# Patient Record
Sex: Female | Born: 1975
Health system: Southern US, Community
[De-identification: ages and names within clinical notes are randomized; demographics above are authoritative.]

## PROBLEM LIST (undated history)

## (undated) DIAGNOSIS — L719 Rosacea, unspecified: Secondary | ICD-10-CM

## (undated) DIAGNOSIS — E119 Type 2 diabetes mellitus without complications: Secondary | ICD-10-CM

## (undated) DIAGNOSIS — M8580 Other specified disorders of bone density and structure, unspecified site: Secondary | ICD-10-CM

## (undated) DIAGNOSIS — R011 Cardiac murmur, unspecified: Secondary | ICD-10-CM

## (undated) DIAGNOSIS — O24419 Gestational diabetes mellitus in pregnancy, unspecified control: Secondary | ICD-10-CM

## (undated) DIAGNOSIS — K900 Celiac disease: Secondary | ICD-10-CM

## (undated) DIAGNOSIS — M359 Systemic involvement of connective tissue, unspecified: Secondary | ICD-10-CM

## (undated) DIAGNOSIS — L409 Psoriasis, unspecified: Secondary | ICD-10-CM

## (undated) HISTORY — DX: Type 2 diabetes mellitus without complications: E11.9

## (undated) HISTORY — PX: BARTHOLIN GLAND CYST EXCISION: SHX565

## (undated) HISTORY — PX: KNEE ARTHROSCOPY: SUR90

## (undated) HISTORY — DX: Celiac disease: K90.0

## (undated) HISTORY — PX: WISDOM TOOTH EXTRACTION: SHX21

## (undated) HISTORY — DX: Rosacea, unspecified: L71.9

## (undated) HISTORY — DX: Psoriasis, unspecified: L40.9

## (undated) HISTORY — DX: Other specified disorders of bone density and structure, unspecified site: M85.80

## (undated) HISTORY — DX: Cardiac murmur, unspecified: R01.1

---

## 2004-07-10 ENCOUNTER — Ambulatory Visit: Payer: Self-pay | Admitting: Family Medicine

## 2004-08-02 ENCOUNTER — Other Ambulatory Visit: Admission: RE | Admit: 2004-08-02 | Discharge: 2004-08-02 | Payer: Self-pay | Admitting: Obstetrics and Gynecology

## 2005-07-04 ENCOUNTER — Ambulatory Visit: Payer: Self-pay | Admitting: Family Medicine

## 2005-07-16 ENCOUNTER — Ambulatory Visit: Payer: Self-pay | Admitting: Internal Medicine

## 2005-07-31 ENCOUNTER — Ambulatory Visit: Payer: Self-pay | Admitting: Family Medicine

## 2005-09-19 ENCOUNTER — Other Ambulatory Visit: Admission: RE | Admit: 2005-09-19 | Discharge: 2005-09-19 | Payer: Self-pay | Admitting: Obstetrics & Gynecology

## 2005-11-25 ENCOUNTER — Ambulatory Visit: Payer: Self-pay | Admitting: Family Medicine

## 2006-01-23 ENCOUNTER — Ambulatory Visit: Payer: Self-pay | Admitting: Family Medicine

## 2006-11-18 DIAGNOSIS — R799 Abnormal finding of blood chemistry, unspecified: Secondary | ICD-10-CM | POA: Insufficient documentation

## 2006-11-18 DIAGNOSIS — I7300 Raynaud's syndrome without gangrene: Secondary | ICD-10-CM | POA: Insufficient documentation

## 2006-11-18 DIAGNOSIS — K900 Celiac disease: Secondary | ICD-10-CM | POA: Insufficient documentation

## 2006-11-18 DIAGNOSIS — D709 Neutropenia, unspecified: Secondary | ICD-10-CM | POA: Insufficient documentation

## 2006-11-18 DIAGNOSIS — M949 Disorder of cartilage, unspecified: Secondary | ICD-10-CM

## 2006-11-18 DIAGNOSIS — M899 Disorder of bone, unspecified: Secondary | ICD-10-CM | POA: Insufficient documentation

## 2006-12-04 ENCOUNTER — Emergency Department (HOSPITAL_COMMUNITY): Admission: EM | Admit: 2006-12-04 | Discharge: 2006-12-04 | Payer: Self-pay | Admitting: Emergency Medicine

## 2006-12-08 ENCOUNTER — Ambulatory Visit: Payer: Self-pay | Admitting: Internal Medicine

## 2006-12-11 ENCOUNTER — Ambulatory Visit: Payer: Self-pay | Admitting: Internal Medicine

## 2006-12-17 ENCOUNTER — Ambulatory Visit: Payer: Self-pay | Admitting: Internal Medicine

## 2006-12-17 LAB — CONVERTED CEMR LAB: H Pylori IgG: NEGATIVE

## 2006-12-29 ENCOUNTER — Ambulatory Visit: Payer: Self-pay | Admitting: Internal Medicine

## 2006-12-29 ENCOUNTER — Encounter: Payer: Self-pay | Admitting: Family Medicine

## 2006-12-29 ENCOUNTER — Encounter: Payer: Self-pay | Admitting: Internal Medicine

## 2007-02-03 ENCOUNTER — Ambulatory Visit (HOSPITAL_COMMUNITY): Admission: RE | Admit: 2007-02-03 | Discharge: 2007-02-03 | Payer: Self-pay | Admitting: Internal Medicine

## 2007-03-09 ENCOUNTER — Telehealth (INDEPENDENT_AMBULATORY_CARE_PROVIDER_SITE_OTHER): Payer: Self-pay | Admitting: *Deleted

## 2007-03-16 ENCOUNTER — Ambulatory Visit: Payer: Self-pay | Admitting: Family Medicine

## 2007-03-16 DIAGNOSIS — R110 Nausea: Secondary | ICD-10-CM

## 2007-03-16 DIAGNOSIS — F411 Generalized anxiety disorder: Secondary | ICD-10-CM | POA: Insufficient documentation

## 2007-06-19 ENCOUNTER — Ambulatory Visit: Payer: Self-pay | Admitting: Family Medicine

## 2007-08-03 ENCOUNTER — Encounter: Payer: Self-pay | Admitting: Family Medicine

## 2007-08-03 ENCOUNTER — Ambulatory Visit: Payer: Self-pay | Admitting: Internal Medicine

## 2007-08-05 ENCOUNTER — Ambulatory Visit: Payer: Self-pay | Admitting: Internal Medicine

## 2007-08-05 DIAGNOSIS — J019 Acute sinusitis, unspecified: Secondary | ICD-10-CM

## 2007-12-31 ENCOUNTER — Encounter: Payer: Self-pay | Admitting: Family Medicine

## 2008-02-04 DIAGNOSIS — K219 Gastro-esophageal reflux disease without esophagitis: Secondary | ICD-10-CM | POA: Insufficient documentation

## 2008-02-05 ENCOUNTER — Ambulatory Visit: Payer: Self-pay | Admitting: Internal Medicine

## 2008-02-05 LAB — CONVERTED CEMR LAB: Tissue Transglutaminase Ab, IgA: 1.2 units (ref <7)

## 2008-02-09 ENCOUNTER — Encounter: Payer: Self-pay | Admitting: Internal Medicine

## 2008-06-09 ENCOUNTER — Encounter: Payer: Self-pay | Admitting: Family Medicine

## 2008-09-07 ENCOUNTER — Encounter: Admission: RE | Admit: 2008-09-07 | Discharge: 2008-09-07 | Payer: Self-pay | Admitting: Obstetrics and Gynecology

## 2008-12-01 ENCOUNTER — Ambulatory Visit: Payer: Self-pay | Admitting: Family Medicine

## 2008-12-01 DIAGNOSIS — R599 Enlarged lymph nodes, unspecified: Secondary | ICD-10-CM | POA: Insufficient documentation

## 2008-12-01 DIAGNOSIS — Z77011 Contact with and (suspected) exposure to lead: Secondary | ICD-10-CM | POA: Insufficient documentation

## 2008-12-02 LAB — CONVERTED CEMR LAB: Anti Nuclear Antibody(ANA): NEGATIVE

## 2008-12-04 LAB — CONVERTED CEMR LAB
Basophils Absolute: 0 10*3/uL (ref 0.0–0.1)
Hemoglobin: 11.8 g/dL — ABNORMAL LOW (ref 12.0–15.0)
Lymphocytes Relative: 22.1 % (ref 12.0–46.0)
Monocytes Relative: 9.9 % (ref 3.0–12.0)
Neutro Abs: 3.9 10*3/uL (ref 1.4–7.7)
Platelets: 138 10*3/uL — ABNORMAL LOW (ref 150.0–400.0)
RDW: 12.3 % (ref 11.5–14.6)
WBC: 5.8 10*3/uL (ref 4.5–10.5)

## 2008-12-05 ENCOUNTER — Encounter (INDEPENDENT_AMBULATORY_CARE_PROVIDER_SITE_OTHER): Payer: Self-pay | Admitting: *Deleted

## 2009-01-07 IMAGING — NM NM HEPATO W/GB/PHARM/[PERSON_NAME]
1 series · 6 of 6 positions shown · non-contrast
Comparison: Abdominal CT 12/11/06.

CLINICAL DATA: Abdominal pain with nausea and vomiting. 
 NUCLEAR MEDICINE HEPATOBILIARY SCAN WITH EJECTION FRACTION:
TECHNIQUE: Sequential abdominal images were obtained following intravenous injection of radiopharmaceutical.  Sequential images were continued following oral ingestion of 8 oz. half-and-half, and the gallbladder ejection fraction was calculated.
 Radiopharmaceutical:  5.2 mCi Yc-LLm Choletec and 8 oz of half-and-half cream.

[hida · 2.40mm/px · 6 of 60 frames shown]
[frame 6/60]
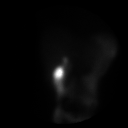
[frame 16/60]
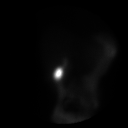
[frame 26/60]
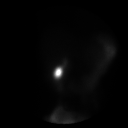
[frame 36/60]
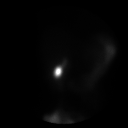
[frame 46/60]
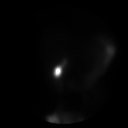
[frame 56/60]
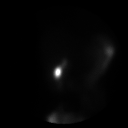

[6 of 6 positions shown; findings below may reference images not displayed]

FINDINGS: The initial images demonstrate homogeneous hepatic activity. There is spontaneous opacification of the biliary system and small bowel.   The stimulated portion of the examination does demonstrate slightly decreased gallbladder contraction.   The gallbladder ejection fraction calculated at 1 hour is 43%.  Values greater than 50% are generally considered normal at this interval.
IMPRESSION: Borderline abnormal gallbladder ejection fraction of 43%.  The cystic and common bile ducts are patent.

## 2009-03-07 ENCOUNTER — Inpatient Hospital Stay (HOSPITAL_COMMUNITY): Admission: RE | Admit: 2009-03-07 | Discharge: 2009-03-10 | Payer: Self-pay | Admitting: Obstetrics & Gynecology

## 2009-03-11 ENCOUNTER — Encounter: Admission: RE | Admit: 2009-03-11 | Discharge: 2009-04-10 | Payer: Self-pay | Admitting: Obstetrics & Gynecology

## 2009-04-11 ENCOUNTER — Encounter: Admission: RE | Admit: 2009-04-11 | Discharge: 2009-05-11 | Payer: Self-pay | Admitting: Obstetrics & Gynecology

## 2009-04-14 ENCOUNTER — Inpatient Hospital Stay (HOSPITAL_COMMUNITY): Admission: AD | Admit: 2009-04-14 | Discharge: 2009-04-14 | Payer: Self-pay | Admitting: Obstetrics and Gynecology

## 2009-04-14 ENCOUNTER — Telehealth: Payer: Self-pay | Admitting: Internal Medicine

## 2010-05-01 ENCOUNTER — Encounter: Payer: Self-pay | Admitting: Family Medicine

## 2010-05-01 ENCOUNTER — Ambulatory Visit: Payer: Self-pay | Admitting: Family Medicine

## 2010-05-01 DIAGNOSIS — R002 Palpitations: Secondary | ICD-10-CM

## 2010-05-01 DIAGNOSIS — L218 Other seborrheic dermatitis: Secondary | ICD-10-CM

## 2010-05-01 LAB — CONVERTED CEMR LAB
Nitrite: NEGATIVE
Protein, U semiquant: NEGATIVE
Urobilinogen, UA: NEGATIVE

## 2010-05-02 ENCOUNTER — Telehealth: Payer: Self-pay | Admitting: Family Medicine

## 2010-05-04 LAB — CONVERTED CEMR LAB: Vit D, 25-Hydroxy: 43 ng/mL (ref 30–89)

## 2010-05-07 LAB — CONVERTED CEMR LAB
Basophils Relative: 0.9 % (ref 0.0–3.0)
CO2: 26 meq/L (ref 19–32)
Calcium: 9.2 mg/dL (ref 8.4–10.5)
Direct LDL: 141.6 mg/dL
Eosinophils Absolute: 0.1 10*3/uL (ref 0.0–0.7)
Eosinophils Relative: 2.2 % (ref 0.0–5.0)
Folate: >20 ng/mL
Hemoglobin: 14.9 g/dL (ref 12.0–15.0)
Lymphocytes Relative: 40.3 % (ref 12.0–46.0)
MCHC: 34.4 g/dL (ref 30.0–36.0)
Monocytes Relative: 12.8 % — ABNORMAL HIGH (ref 3.0–12.0)
Neutro Abs: 1.7 10*3/uL (ref 1.4–7.7)
Neutrophils Relative %: 43.8 % (ref 43.0–77.0)
RBC: 4.61 M/uL (ref 3.87–5.11)
Sodium: 139 meq/L (ref 135–145)
TSH: 0.73 microintl units/mL (ref 0.35–5.50)
Total Bilirubin: 0.6 mg/dL (ref 0.3–1.2)
Total CHOL/HDL Ratio: 3
Triglycerides: 38 mg/dL (ref 0.0–149.0)
VLDL: 7.6 mg/dL (ref 0.0–40.0)
Vitamin B-12: 484 pg/mL (ref 211–911)
WBC: 3.8 10*3/uL — ABNORMAL LOW (ref 4.5–10.5)

## 2010-08-19 ENCOUNTER — Encounter: Payer: Self-pay | Admitting: Internal Medicine

## 2010-08-28 NOTE — Assessment & Plan Note (Signed)
Summary: CPX, FASTING, [employer]/RH.......Marland Kitchen   Vital Signs:  Patient profile:   35 year old female Height:      65 inches Weight:      109.2 pounds BMI:     18.24 Pulse rate:   64 / minute Pulse rhythm:   regular BP sitting:   100 / 70  (right arm) Cuff size:   regular  Vitals Entered By: Aron Baba CMA Deborra Medina) (May 01, 2010 36:01 AM) CC: cpx/fasting, wants to discuss heart flutters, Cough   History of Present Illness: Pt here for cpe  and labs.  Pt having palpatations at rest that occur every few days--not everyday.    Pt states she has the stress that goes along with a 78mold baby and is drinking only 1-2 cups caffeine a Goree---they are usually only half decaf.   No cp --- it does take her breath away for a moment.     Cough      This is a 35year old woman who presents with Cough.  The patient reports productive cough, but denies non-productive cough, pleuritic chest pain, shortness of breath, wheezing, exertional dyspnea, fever, hemoptysis, and malaise.  Associated symtpoms include cold/URI symptoms and nasal congestion.  The patient denies the following symptoms: sore throat, chronic rhinitis, weight loss, acid reflux symptoms, and peripheral edema.  The cough is worse with activity and lying down.  Ineffective prior treatments have included OTC cough medication.    Preventive Screening-Counseling & Management  Alcohol-Tobacco     Alcohol drinks/Rodell: <1     Alcohol type: wine     Smoking Status: never  Caffeine-Diet-Exercise     Caffeine use/[place]orn: 1-2     Caffeine Counseling: not indicated; caffeine use is not excessive or problematic     Does Patient Exercise: no     Exercise Counseling: to improve exercise regimen  Hep-HIV-STD-Contraception     Dental Visit-last 6 months yes     Dental Care Counseling: to seek dental care; no dental care within six months     SBE monthly: yes     SBE Education/Counseling: not indicated; SBE done regularly      Sexual History:   currently monogamous.        Drug Use:  no.    Current Medications (verified): 1)  Mvi, Calcium 2)  Elidel 1 % Crea (Pimecrolimus) .... Apply To Affected Areas As Needed 3)  Fluocinonide 0.05 % Soln (Fluocinonide) 4)  Augmentin 875-125 Mg Tabs (Amoxicillin-Pot Clavulanate) ..Marland Kitchen. 1 By Mouth Two Times A [place] 5)  Mucinex Dm 30-600 Mg Xr12h-Tab (Dextromethorphan-Guaifenesin)  Allergies (verified): 1)  ! Biaxin  Past History:  Past Medical History: Last updated: 02/05/2008 h/o Leukopenia, s/[place] specialist [place] Current Problems:  GERD (ICD-530.81) SINUSITIS- ACUTE-NOS (ICD-461.9) NAUSEA, CHRONIC (ICD-787.02) ANXIETY STATE NOS (ICD-300.00) NEUTROPENIA NOS (ICD-288.00) ANA POSITIVE (ICD-790.99) RAYNAUD'S SYNDROME (ICD-443.0) CELIAC SPRUE (ICD-579.0) OSTEOPENIA (ICD-733.90)  Family History: Last updated: 02/04/2008 Family History of Irritable Bowel Syndrome: Father Family History of Heart Disease: Mother, Father Family History of Colon Cancer: Mternal Grandfather Family History of Colon Polyps: Mother, Father Family History of Diabetes: Grandmother  Social History: Last updated: 05/01/2010 Occupation: stay at home mom Alcohol Use - yes Patient has never smoked.  Married Never Smoked Alcohol use-yes Drug use-no  Risk Factors: Alcohol Use: <1 (05/01/2010) Caffeine Use: 1-2 (05/01/2010) Exercise: no (05/01/2010)  Risk Factors: Smoking Status: never (05/01/2010)  Past Surgical History: Right knee arthroscopy oral surery (wisdom teeth) Caesarean section (2010)  root canals x 2  Family History: Reviewed history from 02/04/2008 and no changes required. Family History of Irritable Bowel Syndrome: Father Family History of Heart Disease: Mother, Father Family History of Colon Cancer: Mternal Grandfather Family History of Colon Polyps: Mother, Father Family History of Diabetes: Grandmother  Social History: Reviewed history from 02/05/2008 and no changes  required. Occupation: stay at home mom Alcohol Use - yes Patient has never smoked.  Married Never Smoked Alcohol use-yes Drug use-no Caffeine use/[place]:  1-2 Does Patient Exercise:  no Dental Care w/in 6 mos.:  yes Sexual History:  currently monogamous Drug Use:  no  Review of Systems      See HPI General:  Denies chills, fatigue, fever, loss of appetite, malaise, sleep disorder, sweats, weakness, and weight loss. Eyes:  Denies blurring, discharge, double vision, eye irritation, eye pain, halos, itching, light sensitivity, red eye, vision loss-1 eye, and vision loss-both eyes; optho-q2y. ENT:  Denies decreased hearing, difficulty swallowing, ear discharge, earache, hoarseness, nasal congestion, nosebleeds, postnasal drainage, ringing in ears, sinus pressure, and sore throat. CV:  Denies bluish discoloration of lips or nails, chest pain or discomfort, difficulty breathing at night, difficulty breathing while lying down, fainting, fatigue, leg cramps with exertion, lightheadness, near fainting, palpitations, shortness of breath with exertion, swelling of feet, swelling of hands, and weight gain. Resp:  Denies chest discomfort, chest pain with inspiration, cough, coughing up blood, excessive snoring, hypersomnolence, morning headaches, pleuritic, shortness of breath, sputum productive, and wheezing. GI:  Denies abdominal pain, bloody stools, change in bowel habits, constipation, dark tarry stools, diarrhea, excessive appetite, gas, hemorrhoids, indigestion, loss of appetite, nausea, vomiting, vomiting blood, and yellowish skin color; celiac sprue--- hospitalized last year--Dr Olevia Perches. GU:  Denies abnormal vaginal bleeding, decreased libido, discharge, dysuria, genital sores, hematuria, incontinence, nocturia, urinary frequency, and urinary hesitancy. MS:  Denies joint pain, joint redness, joint swelling, loss of strength, low back pain, mid back pain, muscle aches, muscle , cramps, muscle weakness,  stiffness, and thoracic pain. Derm:  Denies changes in color of skin, changes in nail beds, dryness, excessive perspiration, flushing, hair loss, insect bite(s), itching, lesion(s), poor wound healing, and rash. Neuro:  Denies brief paralysis, difficulty with concentration, disturbances in coordination, falling down, headaches, inability to speak, memory loss, numbness, poor balance, seizures, sensation of room spinning, tingling, tremors, visual disturbances, and weakness. Psych:  Denies alternate hallucination ( auditory/visual), anxiety, depression, easily angered, easily tearful, irritability, mental problems, panic attacks, sense of great danger, suicidal thoughts/plans, thoughts of violence, unusual visions or sounds, and thoughts /plans of harming others. Endo:  Denies cold intolerance, excessive hunger, excessive thirst, excessive urination, heat intolerance, polyuria, and weight change. Heme:  Denies abnormal bruising, bleeding, enlarge lymph nodes, fevers, pallor, and skin discoloration. Allergy:  Denies hives or rash, itching eyes, persistent infections, seasonal allergies, and sneezing.  Physical Exam  General:  Well-developed,well-nourished,in no acute distress; alert,appropriate and cooperative throughout examination Head:  Normocephalic and atraumatic without obvious abnormalities. No apparent alopecia or balding. Eyes:  vision grossly intact, pupils equal, pupils round, and pupils reactive to light.   Ears:  External ear exam shows no significant lesions or deformities.  Otoscopic examination reveals clear canals, tympanic membranes are intact bilaterally without bulging, retraction, inflammation or discharge. Hearing is grossly normal bilaterally. Nose:  External nasal examination shows no deformity or inflammation. Nasal mucosa are pink and moist without lesions or exudates. Mouth:  Oral mucosa and oropharynx without lesions or exudates.  Teeth in good repair. Neck:  No  deformities,  masses, or tenderness noted. Breasts:  gyn Lungs:  R wheezes and L wheezes.   Heart:  Normal rate and regular rhythm. S1 and S2 normal without gallop, murmur, click, rub or other extra sounds. Abdomen:  Bowel sounds positive,abdomen soft and non-tender without masses, organomegaly or hernias noted. Genitalia:  gyn Msk:  normal ROM, no joint tenderness, no joint swelling, no joint warmth, no redness over joints, no joint deformities, no joint instability, and no crepitation.   Pulses:  R posterior tibial normal, R dorsalis pedis normal, R carotid normal, L posterior tibial normal, L dorsalis pedis normal, and L carotid normal.   Extremities:  No clubbing, cyanosis, edema, or deformity noted with normal full range of motion of all joints.   Neurologic:  No cranial nerve deficits noted. Station and gait are normal. Plantar reflexes are down-going bilaterally. DTRs are symmetrical throughout. Sensory, motor and coordinative functions appear intact. Skin:  Intact without suspicious lesions or rashes Cervical Nodes:  No lymphadenopathy noted Axillary Nodes:  No palpable lymphadenopathy Psych:  Cognition and judgment appear intact. Alert and cooperative with normal attention span and concentration. No apparent delusions, illusions, hallucinations   Impression & Recommendations:  Problem # 1:  Madeline Flores (ICD-V70.0)  Orders: Venipuncture (73710) TLB-Lipid Panel (80061-LIPID) TLB-BMP (Basic Metabolic Panel-BMET) (62694-WNIOEVO) TLB-CBC Platelet - w/Differential (85025-CBCD) TLB-Hepatic/Liver Function Pnl (80076-HEPATIC) TLB-TSH (Thyroid Stimulating Hormone) (84443-TSH) TLB-B12 + Folate Pnl (35009_38182-X93/ZJI) T-Vitamin D (25-Hydroxy) (96789-38101) Specimen Handling (99000) UA Dipstick W/ Micro (manual) (81000)  Problem # 2:  BRONCHITIS- ACUTE (ICD-466.0)  Her updated medication list for this problem includes:    Augmentin 875-125 Mg Tabs (Amoxicillin-pot clavulanate) .Marland Kitchen... 1  by mouth two times a Latella    Mucinex Dm 30-600 Mg Xr12h-tab (Dextromethorphan-guaifenesin)  Take antibiotics and other medications as directed. Encouraged to push clear liquids, get enough rest, and take acetaminophen as needed. To be seen in 5-7 days if no improvement, sooner if worse.  Problem # 3:  PALPITATIONS (ICD-785.1)  Avoid caffeine, chocolate, and stimulants. Stress reduction as well as medication options discussed.   Problem # 5:  PALPITATIONS, OCCASIONAL (ICD-785.1) check event monitor check labs Avoid caffeine, chocolate, and stimulants. Stress reduction as well as medication options discussed.   Complete Medication List: 1)  Mvi, Calcium  2)  Elidel 1 % Crea (Pimecrolimus) .... Apply to affected areas as needed 3)  Fluocinonide 0.05 % Soln (Fluocinonide) 4)  Augmentin 875-125 Mg Tabs (Amoxicillin-pot clavulanate) .Marland Kitchen.. 1 by mouth two times a Hel[place] 5)  Mucinex Dm 30-600 Mg Xr12h-tab (Dextromethorphan-guaifenesin)  Other Orders: Admin 1st Vaccine (75102) Flu Vaccine 69yr + ((58527 Tdap => 725yrIM (9(78242Admin of Any Addtl Vaccine (9(35361Cardiology Referral (Cardiology) Flu Vaccine Consent Questions     Do you have a history of severe allergic reactions to this vaccine? no    Any prior history of allergic reactions to egg and/or gelatin? no    Do you have a sensitivity to the preservative Thimersol? no    Do you have a past history of Guillan-Barre Syndrome? no    Do you currently have an acute febrile illness? no    Have you ever had a severe reaction to latex? no    Vaccine information given and explained to patient? yes    Are you currently pregnant? no    Lot Number:[id number]   Exp Date:01/26/2011   Site Given  Left Deltoid IM Admin 1st Vaccine (9(44315Flu Vaccine 3y37yr (90(40086rescriptions: AUGMENTIN 875-125 MG TABS (AMOXICILLIN-POT  CLAVULANATE) 1 by mouth two times a Roam  #20 x 0   Entered and Authorized by:   Garnet Koyanagi DO   Signed by:   Garnet Koyanagi DO  on 05/01/2010   Method used:   Electronically to        [facility] [phone]* (retail)       [address]       [place], [place]  [address]       Ph: [phone] or [phone]       Fax: [phone]   RxID:   [id number]  .lbflu   Immunizations Administered:  Tetanus Vaccine:    Vaccine Type: Tdap    Site: right deltoid    Mfr: Merck    Dose: 0.5 ml    Route: IM    Given by: Aron Baba CMA ([place])    Exp. Date: 05/17/2012    Lot #: [id number]    VIS given: 06/15/08 version given May 01, 2010.   Immunizations Administered:  Tetanus Vaccine:    Vaccine Type: Tdap    Site: right deltoid    Mfr: Merck    Dose: 0.5 ml    Route: IM    Given by: Aron Baba CMA ([place])    Exp. Date: 05/17/2012    Lot #: [id number]    VIS given: 06/15/08 version given May 01, 2010.   Laboratory Results   Urine Tests   Date/Time Reported: May 01, 2010 10:54 AM   Routine Urinalysis   Color: yellow Appearance: Clear Glucose: negative   (Normal Range: Negative) Bilirubin: negative   (Normal Range: Negative) Ketone: smal (15)   (Normal Range: Negative) Spec. Gravity: <1.005   (Normal Range: 1.003-1.035) Blood: negative   (Normal Range: Negative) pH: 6.5   (Normal Range: 5.0-8.0) Protein: negative   (Normal Range: Negative) Urobilinogen: negative   (Normal Range: 0-1) Nitrite: negative   (Normal Range: Negative) Leukocyte Esterace: negative   (Normal Range: Negative)    Comments: Heath Lark  May 01, 2010 10:54 AM

## 2010-08-28 NOTE — Progress Notes (Signed)
Summary: EVENT MONITOR REFERRAL  Phone Note Other Incoming Call back at [phone] EXT [phone]   Caller: [facility] Summary of Call: IN REFERENCE TO CARDIOLOGY REFERRAL FOR AN EVENT MONITOR, AFTER FAXING ALL CLINICALS & EKG TO [employer] CLINICAL REVIEW, [employer] JUST CALLED & WILL NOT APPROVE THE EVENT MONITOR.  THEIR REASON IS THAT THERE IS NO TREATMENT PLAN IN YOUR NOTES FOR THE PATIENT'S DIAGNOSIS.  I TOLD THEM THAT IS THE PURPOSE OF THE EVENT MONITOR, SO SHE CAN BE TREATED, ETC.... FOR APPROVAL, THEY ARE REQUIRING PEER TO PEER BY CALLING [phone], EXTENSION [phone]. Initial call taken by: Phylliss Bob [facility],  May 02, 2010 10:22 AM  Follow-up for Phone Call        just refer to cardio for eval Follow-up by: Garnet Koyanagi DO,  May 02, 2010 10:59 AM  Additional Follow-up for Phone Call Additional follow up Details #1::        [place]. Additional Follow-up by: Phylliss Bob [facility],  May 11, 2010 1:35 PM

## 2010-10-02 ENCOUNTER — Other Ambulatory Visit: Payer: Self-pay | Admitting: Obstetrics and Gynecology

## 2010-11-02 LAB — CBC
HCT: 43 % (ref 36.0–46.0)
Platelets: 166 10*3/uL (ref 150–400)
RDW: 12.2 % (ref 11.5–15.5)
WBC: 3.9 10*3/uL — ABNORMAL LOW (ref 4.0–10.5)

## 2010-11-02 LAB — COMPREHENSIVE METABOLIC PANEL
Albumin: 4.1 g/dL (ref 3.5–5.2)
BUN: 5 mg/dL — ABNORMAL LOW (ref 6–23)
Creatinine, Ser: 0.77 mg/dL (ref 0.4–1.2)
Total Bilirubin: 0.6 mg/dL (ref 0.3–1.2)
Total Protein: 7.3 g/dL (ref 6.0–8.3)

## 2010-11-03 LAB — CBC
HCT: 30.9 % — ABNORMAL LOW (ref 36.0–46.0)
HCT: 33.6 % — ABNORMAL LOW (ref 36.0–46.0)
HCT: 42.5 % (ref 36.0–46.0)
Hemoglobin: 10.9 g/dL — ABNORMAL LOW (ref 12.0–15.0)
Hemoglobin: 14.6 g/dL (ref 12.0–15.0)
MCHC: 34.3 g/dL (ref 30.0–36.0)
MCHC: 35.1 g/dL (ref 30.0–36.0)
MCV: 96.6 fL (ref 78.0–100.0)
MCV: 98.8 fL (ref 78.0–100.0)
Platelets: 133 10*3/uL — ABNORMAL LOW (ref 150–400)
RBC: 3.2 MIL/uL — ABNORMAL LOW (ref 3.87–5.11)
RBC: 4.39 MIL/uL (ref 3.87–5.11)
RDW: 12.7 % (ref 11.5–15.5)
WBC: 10.3 10*3/uL (ref 4.0–10.5)
WBC: 6.5 10*3/uL (ref 4.0–10.5)
WBC: 8.8 10*3/uL (ref 4.0–10.5)

## 2010-12-11 NOTE — Discharge Summary (Signed)
NAME:  Madeline Flores, Madeline Flores                 ACCOUNT NO.:  [id number]   MEDICAL RECORD NO.:  [id number]          PATIENT TYPE:  INP   LOCATION:  9132                          FACILITY:  [facility]   PHYSICIAN:  Jeannette How, P.A.-C.DATE OF BIRTH:  07-Jan-1976   DATE OF ADMISSION:  03/07/2009  DATE OF DISCHARGE:  03/10/2009                               DISCHARGE SUMMARY   FINAL DIAGNOSIS:  Intrauterine pregnancy at 69 weeks' gestation frank  breech presentation.   PROCEDURE:  Primary low-transverse cesarean section.   SURGEON:  Alden Hipp, MD   COMPLICATIONS:  None.   This 35 year old, G1, P0 is admitted for a primary cesarean section  secondary to a breech presentation.  The patient declined external  cephalic version and desires to proceed with a primary cesarean section.  The patient's antepartum course up to this point has been uncomplicated.  She does have celiac sprue which she controlled with diet throughout her  pregnancy.  Otherwise, no complications.  The patient was taken to the  operating room on August 2010 by Dr. Alden Hipp, where a primary low-  transverse cesarean section was performed with the delivery of a 6 pound  3 ounce female infant with Apgars of 8 and 9.  Delivery went without  complications.  The patient's postoperative course was benign without  any significant fevers.  The patient was felt ready for discharge on  postoperative Stauber #3.  She was sent home on a regular diet, told to  decrease activities, told to continue her prenatal vitamins, was given a  prescription for Percocet 1 every 4-6 hours as needed for pain, told she  could use over-the-counter Motrin or ibuprofen up to 600 mg every 6  hours as needed for pain, and was to follow up in our office in 4 weeks.   LABORATORY ON DISCHARGE:  The patient had a hemoglobin of 11.5, white  blood cell count of 8.8, and platelets of 133,000.      Jeannette How, P.A.-C.     MB/MEDQ  D:  03/27/2009  T:   03/28/2009  Job:  [id number]

## 2010-12-11 NOTE — Assessment & Plan Note (Signed)
[facility]                         GASTROENTEROLOGY OFFICE NOTE   NAME:COLE, Diego                         MRN:          [id number]  DATE:12/08/2006                            DOB:          1975-11-02    Ms. Landry Mellow is a 35 year old white female who comes with a several-Zhong  episode of severe epigastric discomfort with intractable vomiting.  She  was evaluated in the emergency room on Dec 04, 2006, after feeling  nauseated and subsequently developing vomiting of not only food but also  gastric content.  She still has not been feeling well, although she did  go to work today.  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  There has been no fever.  There was one episode of diarrhea,  but she has not had loose stools since the first Brandis.  She has also has  abdominal pain as a child and later on at the age of 23 was diagnosed  with celiac sprue which was in [place], [place].  We do not have the  records, but according to the patient's description, she had a small-  bowel follow-through as well as small-bowel biopsy as well as stool  serologies which were positive.  She has been on strict gluconate-free  diet.  She feels her current episodes of abdominal pain and vomiting are  not associated with history of sprue.  She has had in the last 3 months  about three severe episodes, one on January 26 while at home during the  weekend.  The second episode was on November 10, 2006, in the evening, and  one Dec 04, 2006, while she was at work.  Her weight has decreased about  7 pounds.  She has been followed by Dr. Rockwell Alexandria for renal cell syndrome.  Her family has history of also digestive problems, her father having IBS  with possible ulcers, mother having sarcoidosis with some abdominal  involvement.  Ms. Landry Mellow herself has positive ANA titers in the past and  has been evaluated for  cardiovascular disease, but Dr. Rockwell Alexandria has not  recognized any specific cardiovascular disease.   MEDICATIONS:  Birth control pills and calcium supplements.   PAST MEDICAL HISTORY:  Significant for:  1. Celiac sprue.  2. She also had elevated amylase on one occasion.   FAMILY HISTORY:  Positive for diabetes in grandmother, alcoholism and  heart disease in mother and father, sarcoidosis in mother.   SOCIAL HISTORY:  Single.  Works as a Community education officer.   HABITS:  She drinks alcohol socially.  She does not smoke.   REVIEW OF SYSTEMS:  Positive for eye glasses, allergies.  Weight has  decreased about 7 pounds.   LABORATORY DATA:  In Dr. Ledell Peoples office on November 17, 2006, showed  normal CBC with predominant lymphocytosis.  Normal sed rate of 1.  Anti-  nuclear antibody was 45 which is normal.  Liver function tests as well  as serum albumin were normal.  Repeat blood test at [facility]  emergency room  on Dec 04, 2006, again showed normal chemistries except  for hemo concentrated serum  causing elevated hemoglobin up to 16.3 and  serum albumin up to 4.1.  Her urine specific gravity was 1.022.   PHYSICAL EXAMINATION:  VITAL SIGNS:  Blood pressure 124/72, pulse 84.  Weight 107 pounds.  GENERAL:  She was thin, in no distress.  HEENT:  Sclerae nonicteric.  NECK:  Supple.  Thyroid was normal, no goiter.  There was no cervical  lymphadenopathy.  LUNGS:  Clear to auscultation.  COR:  Rapid S1 and S2.  EXTREMITIES: Showed cold hands with some cyanosis of the fingertips,  some ecchymoses of the two fingertips which were previously evaluated by  Dr. Allyson Sabal.  No edema.  ABDOMEN:  Soft.  There was minimal tenderness in epigastrium.  Normoactive bowel sounds.  No distention, no  hyperactive bowel.  Lower  abdomen was normal.  Right lower quadrant was normal.  RECTAL:  Exam was normal tone.  Stool was hemoccult negative.   IMPRESSION:  A 35 year old white female with three episodes of  abdominal  pain and vomiting in the last 3 months with resulting weight loss and  persistent nausea.  She has celiac sprue and also  positive autoimmune  disease serology markers.  Possibilities here include acute gastritis  with H. pylori, gastric outlet obstruction, or possibly small-bowel  obstruction.  Rule out intersusception.  Also biliary disease would be a  consideration.  I doubt that she has Crohn's disease.  She apparently  had a small-bowel follow-through in [place] which was normal.  She now  is having symptoms of gastroesophageal reflux and esophagitis due to  vomiting   PLAN:  1. CT scan of the abdomen and pelvis.  2. Zofran 4 mg, dispense 5 to take every 6-8 hours p.r.n.  3. Prevacid samples 30 mg, dispense about 12 of them to take 1 a [place].  4. After CT scan, will decide which way to go.  She may need repeat      upper endoscopy and small-bowel biopsy.     Lowella Bandy. Olevia Perches, MD  Electronically Signed    DMB/MedQ  DD: 12/08/2006  DT: 12/08/2006  Job #: [id number]   cc:   Ander Slade. Rockwell Alexandria, M.D.  Frederick A. Aldona Lento, M.D.  Rosalita Chessman, DO

## 2010-12-11 NOTE — Op Note (Signed)
NAMERAYVN, RICKERSON                 ACCOUNT NO.:  [id number]   MEDICAL RECORD NO.:  [id number]          PATIENT TYPE:  INP   LOCATION:  9132                          FACILITY:  [facility]   PHYSICIAN:  Alden Hipp, M.D.   DATE OF BIRTH:  01-08-1976   DATE OF PROCEDURE:  03/07/2009  DATE OF DISCHARGE:                               OPERATIVE REPORT   PREOPERATIVE DIAGNOSES:  A 39-week gestation, frank breech presentation.   POSTOPERATIVE DIAGNOSIS:  A 39-week gestation, frank breech  presentation.   PROCEDURE:  Primary low-transverse cesarean section.   SURGEON:  Alden Hipp, MD   ANESTHESIA:  Spinal.   ESTIMATED BLOOD LOSS:  800 mL.   FINDINGS:  Female infant, Apgar scores 8 and 9, birth weight 6 pounds 3  ounces, clear amniotic fluid.  Placenta was delivered intact and sent  for cord blood donation.   COMPLICATIONS:  None.   INDICATIONS:  This is a 35 year old, primigravid female at 56 weeks'  gestation, who has a persistent frank breech presentation for 4 weeks.  The options of external cephalic version were discussed with the patient  and she declined.   PROCEDURE:  The patient was taken to the operating room and spinal  anesthesia was placed.  She was then placed in the supine position with  left lateral displacement of the uterus and the abdomen was prepped and  draped in sterile fashion.  The bladder was catheterized.  A low-  transverse incision was made and carried down to the fascia which was  extended transversely.  The rectus sheath was then dissected from the  underlying rectus muscle.  The midline was identified and opened by  blunt dissection.  The peritoneum was then entered sharply and extended  bluntly.  The Alexis self-retaining retractor was then placed.  The  lower segment was identified.  The incision was made and carried down to  the amnion.  This incision was then extended bluntly.  The amnion was  ruptured.  The infant was delivered in the frank  breech presentation  without difficulty.  The placenta was then delivered with uterine  massage and traction on the cord.  The membranes were then dissected  from the endometrium and the endometrium was bluntly curettaged.  The  lower segment was then closed in 2 layers, the first a running  interlocking Vicryl 1 suture.  The second a running imbricating Vicryl 1  suture.  Hemostasis was noted to be present.  The retractor was then  removed.  The peritoneum and rectus muscle was then closed with 3-0  Vicryl suture in the midline.  The fascia was closed with running O  Vicryl suture and the skin was closed with staples.  The patient  tolerated the procedure well and left the operating room in good  condition.      Alden Hipp, M.D.  Electronically Signed     RK/MEDQ  D:  03/07/2009  T:  03/07/2009  Job:  [id number]

## 2012-06-19 ENCOUNTER — Encounter: Payer: Self-pay | Admitting: Cardiovascular Disease

## 2012-06-19 ENCOUNTER — Ambulatory Visit (INDEPENDENT_AMBULATORY_CARE_PROVIDER_SITE_OTHER): Payer: BC Managed Care – PPO | Admitting: Cardiovascular Disease

## 2012-06-19 VITALS — BP 117/84 | HR 83 | Ht 63.5 in | Wt 108.4 lb

## 2012-06-19 DIAGNOSIS — R011 Cardiac murmur, unspecified: Secondary | ICD-10-CM

## 2012-06-19 DIAGNOSIS — R002 Palpitations: Secondary | ICD-10-CM

## 2012-06-19 NOTE — Patient Instructions (Addendum)
Your physician has requested that you have an echocardiogram. Echocardiography is a painless test that uses sound waves to create images of your heart. It provides your doctor with information about the size and shape of your heart and how well your heart's chambers and valves are working. This procedure takes approximately one hour. There are no restrictions for this procedure.  Your physician has recommended that you wear a 24 holter monitor. Holter monitors are medical devices that record the heart's electrical activity. Doctors most often use these monitors to diagnose arrhythmias. Arrhythmias are problems with the speed or rhythm of the heartbeat. The monitor is a small, portable device. You can wear one while you do your normal daily activities. This is usually used to diagnose what is causing palpitations/syncope (passing out).  Your physician recommends that you schedule a follow-up appointment as needed.   Your physician recommends that you continue on your current medications as directed. Please refer to the Current Medication list given to you today.

## 2012-06-21 ENCOUNTER — Encounter: Payer: Self-pay | Admitting: Cardiovascular Disease

## 2012-06-28 ENCOUNTER — Encounter: Payer: Self-pay | Admitting: Cardiovascular Disease

## 2012-06-28 NOTE — Progress Notes (Signed)
HPI:  36 year old woman presenting for initial evaluation of cardiac palpitations. The patient has a past history of similar problems, but these have been more bothersome over the last few months. She describes episodic palpitations without associated chest pain or dyspnea. She has not had any recent episodes of syncope or near syncope. Her palpitations are more noticeable at rest. She feels a skipped beat, but no prolonged episodes of perceived tachycardia.  Patient has a history of neurocardiogenic syncope. She has undergone tilt table testing in the past. She has not had a syncopal episode in several years. She's physically active and exercises at the gym a few days per week. She drinks 2 cups of coffee every Lueras. She leads a healthy lifestyle and drinks an adequate amount of fluids. She does not smoke cigarettes or drink alcohol. 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  Outpatient Encounter Prescriptions as of 06/19/2012  Medication Sig Dispense Refill  . LORazepam (ATIVAN) 0.5 MG tablet Take 0.5 mg by mouth at bedtime as needed.      . Omega-3 Fatty Acids (FISH OIL CONCENTRATE PO) Take 1 mg/mL by mouth.      . Prenatal Vit-Fe Fumarate-FA (MULTIVITAMIN-PRENATAL) 27-0.8 MG TABS Take 1 tablet by mouth daily.      Cristino Martes Root 250 MG CAPS Take 1 capsule by mouth daily.        Clarithromycin  No past medical history on file.  No past surgical history on file.  History   Social History  . Marital Status: Married    Spouse Name: N/A    Number of Children: N/A  . Years of Education: N/A   Occupational History  . Not on file.   Social History Main Topics  . Smoking status: Never Smoker   . Smokeless tobacco: Not on file  . Alcohol Use: Not on file  . Drug Use: Not on file  . Sexually Active: Not on file   Other Topics Concern  . Not on file   Social History Narrative  . No narrative on file   Family history: There is no premature CAD in first degree  relatives.  ROS:  General: no fevers/chills/night sweats, positive for fatigue Eyes: no blurry vision, diplopia, or amaurosis ENT: no sore throat or hearing loss, positive for seasonal allergies Resp: no cough, wheezing, or hemoptysis CV: no edema or palpitations GI: no abdominal pain, nausea, vomiting, diarrhea, or constipation. Positive for gastroesophageal reflux disease GU: no dysuria, frequency, or hematuria Skin: no rash Neuro: no headache, numbness, tingling, or weakness of extremities Musculoskeletal: no joint pain or swelling Heme: no bleeding, DVT, or easy bruising Endo: no polydipsia or polyuria  BP 117/84  Pulse 83  Ht 5' 3.5" (1.613 m)  Wt 49.17 kg (108 lb 6.4 oz)  BMI 18.90 kg/m2  PHYSICAL EXAM: Pt is alert and oriented, WD, WN, thin young woman in no distress. HEENT: normal Neck: JVP normal. Carotid upstrokes normal without bruits. No thyromegaly. Lungs: equal expansion, clear bilaterally CV: Apex is discrete and nondisplaced, RRR with a grade 1/6 late systolic murmur and mid-systolic click Abd: soft, NT, +BS, no bruit, no hepatosplenomegaly Back: no CVA tenderness Ext: no C/C/E        Femoral pulses 2+= without bruits        DP/PT pulses intact and = Skin: warm and dry without rash Neuro: CNII-XII intact             Strength intact = bilaterally  EKG:  Normal sinus rhythm with rightward axis, otherwise within normal limits.  ASSESSMENT AND PLAN: 1. Cardiac palpitations. I have recommended an echocardiogram and a Holter monitor for further evaluation. She was advised about reduction in caffeine intake. We discussed liberalizing salt in her diet considering her history of low blood pressure and tendency for neurocardiogenic syncope. She is at minimal risk of ischemic heart disease.  2. Cardiac murmur. Her murmur is suggestive of mitral valve prolapse of mitral regurgitation. Will check an echocardiogram for further evaluation.  We discussed consideration of  a when necessary beta blocker in case she has particularly bad palpitations, but she was not inclined to try this. Pending the results of her studies, I will plan on seeing her back on an as-needed basis.  Sherren Mocha 06/28/2012 9:47 PM

## 2012-07-09 ENCOUNTER — Ambulatory Visit (HOSPITAL_COMMUNITY): Payer: BC Managed Care – PPO | Attending: Internal Medicine

## 2012-07-09 DIAGNOSIS — R011 Cardiac murmur, unspecified: Secondary | ICD-10-CM

## 2012-07-09 DIAGNOSIS — R002 Palpitations: Secondary | ICD-10-CM

## 2012-07-09 DIAGNOSIS — R55 Syncope and collapse: Secondary | ICD-10-CM | POA: Insufficient documentation

## 2012-07-09 NOTE — Progress Notes (Signed)
Echocardiogram performed.  

## 2012-07-20 ENCOUNTER — Encounter: Payer: Self-pay | Admitting: Cardiovascular Disease

## 2012-07-20 NOTE — Telephone Encounter (Signed)
Pt rtn your call/lg

## 2012-07-20 NOTE — Telephone Encounter (Signed)
This encounter was created in error - please disregard.

## 2012-08-10 ENCOUNTER — Telehealth: Payer: Self-pay | Admitting: Cardiovascular Disease

## 2012-08-10 NOTE — Telephone Encounter (Signed)
I will forward this message to Dr Burt Knack to contact the pt.

## 2012-08-10 NOTE — Telephone Encounter (Signed)
New Problem:    Patient called in wanting to speak with Dr. Burt Knack about her issue.  Patient only desires to speak with Dr. Burt Knack.  Please call back if you have any questions.

## 2012-08-10 NOTE — Telephone Encounter (Signed)
I received an email from Ms Sottile regarding billing from her echo and monitor. I have responded and forwarded the email to [employer].  Sherren Mocha 08/10/2012 2:43 PM

## 2012-09-19 ENCOUNTER — Other Ambulatory Visit: Payer: Self-pay | Admitting: *Deleted

## 2012-09-19 ENCOUNTER — Ambulatory Visit (INDEPENDENT_AMBULATORY_CARE_PROVIDER_SITE_OTHER): Payer: BC Managed Care – PPO | Admitting: Emergency Medicine

## 2012-09-19 VITALS — BP 113/75 | HR 94 | Temp 99.7°F | Resp 17 | Ht 64.5 in | Wt 109.8 lb

## 2012-09-19 DIAGNOSIS — J018 Other acute sinusitis: Secondary | ICD-10-CM

## 2012-09-19 MED ORDER — PSEUDOEPHEDRINE-GUAIFENESIN ER 60-600 MG PO TB12: 1.0000 | ORAL_TABLET | Freq: Two times a day (BID) | ORAL | Status: AC

## 2012-09-19 MED ORDER — AMOXICILLIN-POT CLAVULANATE 875-125 MG PO TABS: 1.0000 | ORAL_TABLET | Freq: Two times a day (BID) | ORAL | Status: DC

## 2012-09-19 NOTE — Progress Notes (Signed)
[facility] [address] [place] [address] [phone]  Date:  09/19/2012   Name:  Madeline Flores   DOB:  04/21/1976   MRN:  [id number]  PCP:  Garnet Koyanagi, DO    Chief Complaint: Sinusitis and Headache   History of Present Illness:  Madeline Flores is a 37 y.o. very pleasant female patient who presents with the following:  Ill last week with a "cold".  Now this week her nasal drainage has turned green and she has fever and chills and pain in her maxillary sinuses and teeth.  No improvement with OTC medications.   Has history of recurrent sinusitis.  No cough or sore throat.  No wheezing or shortness of breath, nausea or vomiting.  Patient Active Problem List  Diagnosis  . NEUTROPENIA NOS  . ANXIETY STATE NOS  . RAYNAUD'S SYNDROME  . SINUSITIS- ACUTE-NOS  . GERD  . CELIAC SPRUE  . OTHER SEBORRHEIC DERMATITIS  . OSTEOPENIA  . Palpitations  . CERVICAL LYMPHADENOPATHY  . NAUSEA, CHRONIC  . ANA POSITIVE  . PERSONAL HISTORY CONTACT WITH & EXPOSURE TO LEAD    Past Medical History  Diagnosis Date  . Celiac sprue   . Heart murmur   . Osteopenia     Past Surgical History  Procedure Laterality Date  . Cesarean section      History  Substance Use Topics  . Smoking status: Never Smoker   . Smokeless tobacco: Not on file  . Alcohol Use: No    Family History  Problem Relation Age of Onset  . Heart disease Mother   . Asthma Brother     Allergies  Allergen Reactions  . Clarithromycin     REACTION: UPSET STOMACH    Medication list has been reviewed and updated.  Current Outpatient Prescriptions on File Prior to Visit  Medication Sig Dispense Refill  . LORazepam (ATIVAN) 0.5 MG tablet Take 0.5 mg by mouth at bedtime as needed.      . Omega-3 Fatty Acids (FISH OIL CONCENTRATE PO) Take 1 mg/mL by mouth.       No current facility-administered medications on file prior to visit.    Review of Systems:  As per HPI, otherwise negative.     Physical Examination: Filed Vitals:   09/19/12 1737  BP: 113/75  Pulse: 94  Temp: 99.7 F (37.6 C)  Resp: 17   Filed Vitals:   09/19/12 1737  Height: 5' 4.5" (1.638 m)  Weight: 109 lb 12.8 oz (49.805 kg)   Body mass index is 18.56 kg/(m^2). Ideal Body Weight: Weight in (lb) to have BMI = 25: 147.6  GEN: WDWN, NAD, Non-toxic, A & O x 3 HEENT: Atraumatic, Normocephalic. Neck supple. No masses, No LAD. Ears and Nose: No external deformity. CV: RRR, No M/G/R. No JVD. No thrill. No extra heart sounds. PULM: CTA B, no wheezes, crackles, rhonchi. No retractions. No resp. distress. No accessory muscle use. ABD: S, NT, ND, +BS. No rebound. No HSM. EXTR: No c/c/e NEURO Normal gait.  PSYCH: Normally interactive. Conversant. Not depressed or anxious appearing.  Calm demeanor.    Assessment and Plan: Sinusitis augmentin mucinex d   Roselee Culver, MD

## 2012-09-19 NOTE — Patient Instructions (Addendum)

## 2012-10-20 ENCOUNTER — Telehealth: Payer: Self-pay | Admitting: Family Medicine

## 2012-10-20 NOTE — Telephone Encounter (Signed)
Patient would like copy of immunizations mailed to her home address. Pt is aware only flu and tdap are listed.

## 2012-10-20 NOTE — Telephone Encounter (Signed)
Report printed off and mailed to patient

## 2012-10-27 ENCOUNTER — Ambulatory Visit (INDEPENDENT_AMBULATORY_CARE_PROVIDER_SITE_OTHER): Payer: BC Managed Care – PPO

## 2012-10-27 DIAGNOSIS — Z111 Encounter for screening for respiratory tuberculosis: Secondary | ICD-10-CM

## 2012-10-29 LAB — TB SKIN TEST: TB Skin Test: NEGATIVE

## 2013-02-18 ENCOUNTER — Other Ambulatory Visit: Payer: Self-pay | Admitting: Obstetrics and Gynecology

## 2013-06-01 ENCOUNTER — Other Ambulatory Visit: Payer: Self-pay | Admitting: Obstetrics and Gynecology

## 2013-06-01 DIAGNOSIS — N644 Mastodynia: Secondary | ICD-10-CM

## 2013-06-18 ENCOUNTER — Other Ambulatory Visit: Payer: Self-pay | Admitting: Obstetrics and Gynecology

## 2013-06-18 ENCOUNTER — Ambulatory Visit
Admission: RE | Admit: 2013-06-18 | Discharge: 2013-06-18 | Disposition: A | Discharge status: Home / Self Care | Payer: BC Managed Care – PPO | Source: Ambulatory Visit | Attending: Obstetrics and Gynecology | Admitting: Obstetrics and Gynecology

## 2013-06-18 DIAGNOSIS — N644 Mastodynia: Secondary | ICD-10-CM

## 2014-07-05 ENCOUNTER — Other Ambulatory Visit: Payer: Self-pay | Admitting: Obstetrics and Gynecology

## 2014-07-06 LAB — CYTOLOGY - PAP

## 2014-09-05 ENCOUNTER — Telehealth: Payer: Self-pay | Admitting: Family Medicine

## 2014-09-05 NOTE — Telephone Encounter (Signed)
Caller name:Madeline Flores Relationship to patient:Self Can be reached:[phone]   Reason for call: PT stating needing Referral to see Dr. Levada Dy Hox Appt on 09/19/14- change in insurance [employer]- member ID [id number]

## 2014-09-05 NOTE — Telephone Encounter (Signed)
Reason for referral-- joint pain, abnormal labs, photo sensitivity  Rheumatologist   She has been seeing this dr, just needs a referral due to new insurance

## 2014-09-05 NOTE — Telephone Encounter (Signed)
She is basically a new pt-- she needs appointment

## 2014-09-05 NOTE — Telephone Encounter (Signed)
The patient has not been seen since 05/01/2010 and is requesting a referral to her Rheumatologist, she is already established she just needs to referral due to insurance. Please advise      KP

## 2014-09-05 NOTE — Telephone Encounter (Signed)
CPE scheduled 2/11/6 at 3:30.    KP

## 2014-09-05 NOTE — Telephone Encounter (Signed)
Msg left to call the office, I need a reason for the referral and type of referral (type of provider) if she calls back.    KP

## 2014-09-08 ENCOUNTER — Encounter: Payer: Self-pay | Admitting: Family Medicine

## 2014-09-08 ENCOUNTER — Ambulatory Visit (INDEPENDENT_AMBULATORY_CARE_PROVIDER_SITE_OTHER): Payer: 59 | Admitting: Family Medicine

## 2014-09-08 VITALS — BP 114/70 | HR 90 | Temp 98.3°F | Ht 64.5 in | Wt 112.6 lb

## 2014-09-08 DIAGNOSIS — Z0000 Encounter for general adult medical examination without abnormal findings: Secondary | ICD-10-CM

## 2014-09-08 DIAGNOSIS — M359 Systemic involvement of connective tissue, unspecified: Secondary | ICD-10-CM

## 2014-09-08 DIAGNOSIS — L568 Other specified acute skin changes due to ultraviolet radiation: Secondary | ICD-10-CM

## 2014-09-08 LAB — POCT URINALYSIS DIPSTICK
BILIRUBIN UA: NEGATIVE
GLUCOSE UA: NEGATIVE
KETONES UA: NEGATIVE
Leukocytes, UA: NEGATIVE
Nitrite, UA: NEGATIVE
Protein, UA: NEGATIVE
RBC UA: NEGATIVE
SPEC GRAV UA: 1.02
Urobilinogen, UA: NEGATIVE
pH, UA: 6

## 2014-09-08 NOTE — Patient Instructions (Signed)
Preventive Care for Adults A healthy lifestyle and preventive care can promote health and wellness. Preventive health guidelines for women include the following key practices.  A routine yearly physical is a good way to check with your health care provider about your health and preventive screening. It is a chance to share any concerns and updates on your health and to receive a thorough exam.  Visit your dentist for a routine exam and preventive care every 6 months. Brush your teeth twice a [place] and floss once a [place]. Good oral hygiene prevents tooth decay and gum disease.  The frequency of eye exams is based on your age, health, family medical history, use of contact lenses, and other factors. Follow your health care provider's recommendations for frequency of eye exams.  Eat a healthy diet. Foods like vegetables, fruits, whole grains, low-fat dairy products, and lean protein foods contain the nutrients you need without too many calories. Decrease your intake of foods high in solid fats, added sugars, and salt. Eat the right amount of calories for you.Get information about a proper diet from your health care provider, if necessary.  Regular physical exercise is one of the most important things you can do for your health. Most adults should get at least 150 minutes of moderate-intensity exercise (any activity that increases your heart rate and causes you to sweat) each week. In addition, most adults need muscle-strengthening exercises on 2 or more days a week.  Maintain a healthy weight. The body mass index (BMI) is a screening tool to identify possible weight problems. It provides an estimate of body fat based on height and weight. Your health care provider can find your BMI and can help you achieve or maintain a healthy weight.For adults 20 years and older:  A BMI below 18.5 is considered underweight.  A BMI of 18.5 to 24.9 is normal.  A BMI of 25 to 29.9 is considered overweight.  A BMI of  30 and above is considered obese.  Maintain normal blood lipids and cholesterol levels by exercising and minimizing your intake of saturated fat. Eat a balanced diet with plenty of fruit and vegetables. Blood tests for lipids and cholesterol should begin at age 76 and be repeated every 5 years. If your lipid or cholesterol levels are high, you are over 50, or you are at high risk for heart disease, you may need your cholesterol levels checked more frequently.Ongoing high lipid and cholesterol levels should be treated with medicines if diet and exercise are not working.  If you smoke, find out from your health care provider how to quit. If you do not use tobacco, do not start.  Lung cancer screening is recommended for adults aged 22-80 years who are at high risk for developing lung cancer because of a history of smoking. A yearly low-dose CT scan of the lungs is recommended for people who have at least a 30-pack-year history of smoking and are a current smoker or have quit within the past 15 years. A pack year of smoking is smoking an average of 1 pack of cigarettes a [place] for 1 year (for example: 1 pack a [place] for 30 years or 2 packs a [place] for 15 years). Yearly screening should continue until the smoker has stopped smoking for at least 15 years. Yearly screening should be stopped for people who develop a health problem that would prevent them from having lung cancer treatment.  If you are pregnant, do not drink alcohol. If you are breastfeeding,  be very cautious about drinking alcohol. If you are not pregnant and choose to drink alcohol, do not have more than 1 drink per [place]. One drink is considered to be 12 ounces (355 mL) of beer, 5 ounces (148 mL) of wine, or 1.5 ounces (44 mL) of liquor.  Avoid use of street drugs. Do not share needles with anyone. Ask for help if you need support or instructions about stopping the use of drugs.  High blood pressure causes heart disease and increases the risk of  stroke. Your blood pressure should be checked at least every 1 to 2 years. Ongoing high blood pressure should be treated with medicines if weight loss and exercise do not work.  If you are 75-52 years old, ask your health care provider if you should take aspirin to prevent strokes.  Diabetes screening involves taking a blood sample to check your fasting blood sugar level. This should be done once every 3 years, after age 15, if you are within normal weight and without risk factors for diabetes. Testing should be considered at a younger age or be carried out more frequently if you are overweight and have at least 1 risk factor for diabetes.  Breast cancer screening is essential preventive care for women. You should practice "breast self-awareness." This means understanding the normal appearance and feel of your breasts and may include breast self-examination. Any changes detected, no matter how small, should be reported to a health care provider. Women in their 58s and 30s should have a clinical breast exam (CBE) by a health care provider as part of a regular health exam every 1 to 3 years. After age 16, women should have a CBE every year. Starting at age 53, women should consider having a mammogram (breast X-ray test) every year. Women who have a family history of breast cancer should talk to their health care provider about genetic screening. Women at a high risk of breast cancer should talk to their health care providers about having an MRI and a mammogram every year.  Breast cancer gene (BRCA)-related cancer risk assessment is recommended for women who have family members with BRCA-related cancers. BRCA-related cancers include breast, ovarian, tubal, and peritoneal cancers. Having family members with these cancers may be associated with an increased risk for harmful changes (mutations) in the breast cancer genes BRCA1 and BRCA2. Results of the assessment will determine the need for genetic counseling and  BRCA1 and BRCA2 testing.  Routine pelvic exams to screen for cancer are no longer recommended for nonpregnant women who are considered low risk for cancer of the pelvic organs (ovaries, uterus, and vagina) and who do not have symptoms. Ask your health care provider if a screening pelvic exam is right for you.  If you have had past treatment for cervical cancer or a condition that could lead to cancer, you need Pap tests and screening for cancer for at least 20 years after your treatment. If Pap tests have been discontinued, your risk factors (such as having a new sexual partner) need to be reassessed to determine if screening should be resumed. Some women have medical problems that increase the chance of getting cervical cancer. In these cases, your health care provider may recommend more frequent screening and Pap tests.  The HPV test is an additional test that may be used for cervical cancer screening. The HPV test looks for the virus that can cause the cell changes on the cervix. The cells collected during the Pap test can be  tested for HPV. The HPV test could be used to screen women aged 30 years and older, and should be used in women of any age who have unclear Pap test results. After the age of 30, women should have HPV testing at the same frequency as a Pap test.  Colorectal cancer can be detected and often prevented. Most routine colorectal cancer screening begins at the age of 50 years and continues through age 75 years. However, your health care provider may recommend screening at an earlier age if you have risk factors for colon cancer. On a yearly basis, your health care provider may provide home test kits to check for hidden blood in the stool. Use of a small camera at the end of a tube, to directly examine the colon (sigmoidoscopy or colonoscopy), can detect the earliest forms of colorectal cancer. Talk to your health care provider about this at age 50, when routine screening begins. Direct  exam of the colon should be repeated every 5-10 years through age 75 years, unless early forms of pre-cancerous polyps or small growths are found.  People who are at an increased risk for hepatitis B should be screened for this virus. You are considered at high risk for hepatitis B if:  You were born in a country where hepatitis B occurs often. Talk with your health care provider about which countries are considered high risk.  Your parents were born in a high-risk country and you have not received a shot to protect against hepatitis B (hepatitis B vaccine).  You have HIV or AIDS.  You use needles to inject street drugs.  You live with, or have sex with, someone who has hepatitis B.  You get hemodialysis treatment.  You take certain medicines for conditions like cancer, organ transplantation, and autoimmune conditions.  Hepatitis C blood testing is recommended for all people born from 1945 through 1965 and any individual with known risks for hepatitis C.  Practice safe sex. Use condoms and avoid high-risk sexual practices to reduce the spread of sexually transmitted infections (STIs). STIs include gonorrhea, chlamydia, syphilis, trichomonas, herpes, HPV, and human immunodeficiency virus (HIV). Herpes, HIV, and HPV are viral illnesses that have no cure. They can result in disability, cancer, and death.  You should be screened for sexually transmitted illnesses (STIs) including gonorrhea and chlamydia if:  You are sexually active and are younger than 24 years.  You are older than 24 years and your health care provider tells you that you are at risk for this type of infection.  Your sexual activity has changed since you were last screened and you are at an increased risk for chlamydia or gonorrhea. Ask your health care provider if you are at risk.  If you are at risk of being infected with HIV, it is recommended that you take a prescription medicine daily to prevent HIV infection. This is  called preexposure prophylaxis (PrEP). You are considered at risk if:  You are a heterosexual woman, are sexually active, and are at increased risk for HIV infection.  You take drugs by injection.  You are sexually active with a partner who has HIV.  Talk with your health care provider about whether you are at high risk of being infected with HIV. If you choose to begin PrEP, you should first be tested for HIV. You should then be tested every 3 months for as long as you are taking PrEP.  Osteoporosis is a disease in which the bones lose minerals and strength   with aging. This can result in serious bone fractures or breaks. The risk of osteoporosis can be identified using a bone density scan. Women ages 65 years and over and women at risk for fractures or osteoporosis should discuss screening with their health care providers. Ask your health care provider whether you should take a calcium supplement or vitamin D to reduce the rate of osteoporosis.  Menopause can be associated with physical symptoms and risks. Hormone replacement therapy is available to decrease symptoms and risks. You should talk to your health care provider about whether hormone replacement therapy is right for you.  Use sunscreen. Apply sunscreen liberally and repeatedly throughout the [place]. You should seek shade when your shadow is shorter than you. Protect yourself by wearing long sleeves, pants, a wide-brimmed hat, and sunglasses year round, whenever you are outdoors.  Once a month, do a whole body skin exam, using a mirror to look at the skin on your back. Tell your health care provider of new moles, moles that have irregular borders, moles that are larger than a pencil eraser, or moles that have changed in shape or color.  Stay current with required vaccines (immunizations).  Influenza vaccine. All adults should be immunized every year.  Tetanus, diphtheria, and acellular pertussis (Td, Tdap) vaccine. Pregnant women should  receive 1 dose of Tdap vaccine during each pregnancy. The dose should be obtained regardless of the length of time since the last dose. Immunization is preferred during the 27th-36th week of gestation. An adult who has not previously received Tdap or who does not know her vaccine status should receive 1 dose of Tdap. This initial dose should be followed by tetanus and diphtheria toxoids (Td) booster doses every 10 years. Adults with an unknown or incomplete history of completing a 3-dose immunization series with Td-containing vaccines should begin or complete a primary immunization series including a Tdap dose. Adults should receive a Td booster every 10 years.  Varicella vaccine. An adult without evidence of immunity to varicella should receive 2 doses or a second dose if she has previously received 1 dose. Pregnant females who do not have evidence of immunity should receive the first dose after pregnancy. This first dose should be obtained before leaving the health care facility. The second dose should be obtained 4-8 weeks after the first dose.  Human papillomavirus (HPV) vaccine. Females aged 13-26 years who have not received the vaccine previously should obtain the 3-dose series. The vaccine is not recommended for use in pregnant females. However, pregnancy testing is not needed before receiving a dose. If a female is found to be pregnant after receiving a dose, no treatment is needed. In that case, the remaining doses should be delayed until after the pregnancy. Immunization is recommended for any person with an immunocompromised condition through the age of 26 years if she did not get any or all doses earlier. During the 3-dose series, the second dose should be obtained 4-8 weeks after the first dose. The third dose should be obtained 24 weeks after the first dose and 16 weeks after the second dose.  Zoster vaccine. One dose is recommended for adults aged 60 years or older unless certain conditions are  present.  Measles, mumps, and rubella (MMR) vaccine. Adults born before 1957 generally are considered immune to measles and mumps. Adults born in 1957 or later should have 1 or more doses of MMR vaccine unless there is a contraindication to the vaccine or there is laboratory evidence of immunity to   each of the three diseases. A routine second dose of MMR vaccine should be obtained at least 28 days after the first dose for students attending postsecondary schools, health care workers, or international travelers. People who received inactivated measles vaccine or an unknown type of measles vaccine during 1963-1967 should receive 2 doses of MMR vaccine. People who received inactivated mumps vaccine or an unknown type of mumps vaccine before 1979 and are at high risk for mumps infection should consider immunization with 2 doses of MMR vaccine. For females of childbearing age, rubella immunity should be determined. If there is no evidence of immunity, females who are not pregnant should be vaccinated. If there is no evidence of immunity, females who are pregnant should delay immunization until after pregnancy. Unvaccinated health care workers born before 1957 who lack laboratory evidence of measles, mumps, or rubella immunity or laboratory confirmation of disease should consider measles and mumps immunization with 2 doses of MMR vaccine or rubella immunization with 1 dose of MMR vaccine.  Pneumococcal 13-valent conjugate (PCV13) vaccine. When indicated, a person who is uncertain of her immunization history and has no record of immunization should receive the PCV13 vaccine. An adult aged 19 years or older who has certain medical conditions and has not been previously immunized should receive 1 dose of PCV13 vaccine. This PCV13 should be followed with a dose of pneumococcal polysaccharide (PPSV23) vaccine. The PPSV23 vaccine dose should be obtained at least 8 weeks after the dose of PCV13 vaccine. An adult aged 19  years or older who has certain medical conditions and previously received 1 or more doses of PPSV23 vaccine should receive 1 dose of PCV13. The PCV13 vaccine dose should be obtained 1 or more years after the last PPSV23 vaccine dose.  Pneumococcal polysaccharide (PPSV23) vaccine. When PCV13 is also indicated, PCV13 should be obtained first. All adults aged 65 years and older should be immunized. An adult younger than age 65 years who has certain medical conditions should be immunized. Any person who resides in a nursing home or long-term care facility should be immunized. An adult smoker should be immunized. People with an immunocompromised condition and certain other conditions should receive both PCV13 and PPSV23 vaccines. People with human immunodeficiency virus (HIV) infection should be immunized as soon as possible after diagnosis. Immunization during chemotherapy or radiation therapy should be avoided. Routine use of PPSV23 vaccine is not recommended for American Indians, [place] Natives, or people younger than 65 years unless there are medical conditions that require PPSV23 vaccine. When indicated, people who have unknown immunization and have no record of immunization should receive PPSV23 vaccine. One-time revaccination 5 years after the first dose of PPSV23 is recommended for people aged 19-64 years who have chronic kidney failure, nephrotic syndrome, asplenia, or immunocompromised conditions. People who received 1-2 doses of PPSV23 before age 65 years should receive another dose of PPSV23 vaccine at age 65 years or later if at least 5 years have passed since the previous dose. Doses of PPSV23 are not needed for people immunized with PPSV23 at or after age 65 years.  Meningococcal vaccine. Adults with asplenia or persistent complement component deficiencies should receive 2 doses of quadrivalent meningococcal conjugate (MenACWY-D) vaccine. The doses should be obtained at least 2 months apart.  Microbiologists working with certain meningococcal bacteria, military recruits, people at risk during an outbreak, and people who travel to or live in countries with a high rate of meningitis should be immunized. A first-year college student up through age   21 years who is living in a residence hall should receive a dose if she did not receive a dose on or after her 16th birthday. Adults who have certain high-risk conditions should receive one or more doses of vaccine.  Hepatitis A vaccine. Adults who wish to be protected from this disease, have certain high-risk conditions, work with hepatitis A-infected animals, work in hepatitis A research labs, or travel to or work in countries with a high rate of hepatitis A should be immunized. Adults who were previously unvaccinated and who anticipate close contact with an international adoptee during the first 60 days after arrival in the [place] from a country with a high rate of hepatitis A should be immunized.  Hepatitis B vaccine. Adults who wish to be protected from this disease, have certain high-risk conditions, may be exposed to blood or other infectious body fluids, are household contacts or sex partners of hepatitis B positive people, are clients or workers in certain care facilities, or travel to or work in countries with a high rate of hepatitis B should be immunized.  Haemophilus influenzae type b (Hib) vaccine. A previously unvaccinated person with asplenia or sickle cell disease or having a scheduled splenectomy should receive 1 dose of Hib vaccine. Regardless of previous immunization, a recipient of a hematopoietic stem cell transplant should receive a 3-dose series 6-12 months after her successful transplant. Hib vaccine is not recommended for adults with HIV infection. Preventive Services / Frequency Ages 64 to 68 years  Blood pressure check.** / Every 1 to 2 years.  Lipid and cholesterol check.** / Every 5 years beginning at age  22.  Clinical breast exam.** / Every 3 years for women in their 88s and 53s.  BRCA-related cancer risk assessment.** / For women who have family members with a BRCA-related cancer (breast, ovarian, tubal, or peritoneal cancers).  Pap test.** / Every 2 years from ages 90 through 51. Every 3 years starting at age 21 through age 56 or 3 with a history of 3 consecutive normal Pap tests.  HPV screening.** / Every 3 years from ages 24 through ages 1 to 46 with a history of 3 consecutive normal Pap tests.  Hepatitis C blood test.** / For any individual with known risks for hepatitis C.  Skin self-exam. / Monthly.  Influenza vaccine. / Every year.  Tetanus, diphtheria, and acellular pertussis (Tdap, Td) vaccine.** / Consult your health care provider. Pregnant women should receive 1 dose of Tdap vaccine during each pregnancy. 1 dose of Td every 10 years.  Varicella vaccine.** / Consult your health care provider. Pregnant females who do not have evidence of immunity should receive the first dose after pregnancy.  HPV vaccine. / 3 doses over 6 months, if 72 and younger. The vaccine is not recommended for use in pregnant females. However, pregnancy testing is not needed before receiving a dose.  Measles, mumps, rubella (MMR) vaccine.** / You need at least 1 dose of MMR if you were born in 1957 or later. You may also need a 2nd dose. For females of childbearing age, rubella immunity should be determined. If there is no evidence of immunity, females who are not pregnant should be vaccinated. If there is no evidence of immunity, females who are pregnant should delay immunization until after pregnancy.  Pneumococcal 13-valent conjugate (PCV13) vaccine.** / Consult your health care provider.  Pneumococcal polysaccharide (PPSV23) vaccine.** / 1 to 2 doses if you smoke cigarettes or if you have certain conditions.  Meningococcal vaccine.** /  1 dose if you are age 19 to 21 years and a first-year college  student living in a residence hall, or have one of several medical conditions, you need to get vaccinated against meningococcal disease. You may also need additional booster doses.  Hepatitis A vaccine.** / Consult your health care provider.  Hepatitis B vaccine.** / Consult your health care provider.  Haemophilus influenzae type b (Hib) vaccine.** / Consult your health care provider. Ages 40 to 64 years  Blood pressure check.** / Every 1 to 2 years.  Lipid and cholesterol check.** / Every 5 years beginning at age 20 years.  Lung cancer screening. / Every year if you are aged 55-80 years and have a 30-pack-year history of smoking and currently smoke or have quit within the past 15 years. Yearly screening is stopped once you have quit smoking for at least 15 years or develop a health problem that would prevent you from having lung cancer treatment.  Clinical breast exam.** / Every year after age 40 years.  BRCA-related cancer risk assessment.** / For women who have family members with a BRCA-related cancer (breast, ovarian, tubal, or peritoneal cancers).  Mammogram.** / Every year beginning at age 40 years and continuing for as long as you are in good health. Consult with your health care provider.  Pap test.** / Every 3 years starting at age 30 years through age 65 or 70 years with a history of 3 consecutive normal Pap tests.  HPV screening.** / Every 3 years from ages 30 years through ages 65 to 70 years with a history of 3 consecutive normal Pap tests.  Fecal occult blood test (FOBT) of stool. / Every year beginning at age 50 years and continuing until age 75 years. You may not need to do this test if you get a colonoscopy every 10 years.  Flexible sigmoidoscopy or colonoscopy.** / Every 5 years for a flexible sigmoidoscopy or every 10 years for a colonoscopy beginning at age 50 years and continuing until age 75 years.  Hepatitis C blood test.** / For all people born from 1945 through  1965 and any individual with known risks for hepatitis C.  Skin self-exam. / Monthly.  Influenza vaccine. / Every year.  Tetanus, diphtheria, and acellular pertussis (Tdap/Td) vaccine.** / Consult your health care provider. Pregnant women should receive 1 dose of Tdap vaccine during each pregnancy. 1 dose of Td every 10 years.  Varicella vaccine.** / Consult your health care provider. Pregnant females who do not have evidence of immunity should receive the first dose after pregnancy.  Zoster vaccine.** / 1 dose for adults aged 60 years or older.  Measles, mumps, rubella (MMR) vaccine.** / You need at least 1 dose of MMR if you were born in 1957 or later. You may also need a 2nd dose. For females of childbearing age, rubella immunity should be determined. If there is no evidence of immunity, females who are not pregnant should be vaccinated. If there is no evidence of immunity, females who are pregnant should delay immunization until after pregnancy.  Pneumococcal 13-valent conjugate (PCV13) vaccine.** / Consult your health care provider.  Pneumococcal polysaccharide (PPSV23) vaccine.** / 1 to 2 doses if you smoke cigarettes or if you have certain conditions.  Meningococcal vaccine.** / Consult your health care provider.  Hepatitis A vaccine.** / Consult your health care provider.  Hepatitis B vaccine.** / Consult your health care provider.  Haemophilus influenzae type b (Hib) vaccine.** / Consult your health care provider. Ages 65   years and over  Blood pressure check.** / Every 1 to 2 years.  Lipid and cholesterol check.** / Every 5 years beginning at age 24 years.  Lung cancer screening. / Every year if you are aged 41-80 years and have a 30-pack-year history of smoking and currently smoke or have quit within the past 15 years. Yearly screening is stopped once you have quit smoking for at least 15 years or develop a health problem that would prevent you from having lung cancer  treatment.  Clinical breast exam.** / Every year after age 15 years.  BRCA-related cancer risk assessment.** / For women who have family members with a BRCA-related cancer (breast, ovarian, tubal, or peritoneal cancers).  Mammogram.** / Every year beginning at age 81 years and continuing for as long as you are in good health. Consult with your health care provider.  Pap test.** / Every 3 years starting at age 17 years through age 61 or 65 years with 3 consecutive normal Pap tests. Testing can be stopped between 65 and 70 years with 3 consecutive normal Pap tests and no abnormal Pap or HPV tests in the past 10 years.  HPV screening.** / Every 3 years from ages 60 years through ages 53 or 13 years with a history of 3 consecutive normal Pap tests. Testing can be stopped between 65 and 70 years with 3 consecutive normal Pap tests and no abnormal Pap or HPV tests in the past 10 years.  Fecal occult blood test (FOBT) of stool. / Every year beginning at age 55 years and continuing until age 68 years. You may not need to do this test if you get a colonoscopy every 10 years.  Flexible sigmoidoscopy or colonoscopy.** / Every 5 years for a flexible sigmoidoscopy or every 10 years for a colonoscopy beginning at age 20 years and continuing until age 53 years.  Hepatitis C blood test.** / For all people born from [birth year] through 1966 and any individual with known risks for hepatitis C.  Osteoporosis screening.** / A one-time screening for women ages 26 years and over and women at risk for fractures or osteoporosis.  Skin self-exam. / Monthly.  Influenza vaccine. / Every year.  Tetanus, diphtheria, and acellular pertussis (Tdap/Td) vaccine.** / 1 dose of Td every 10 years.  Varicella vaccine.** / Consult your health care provider.  Zoster vaccine.** / 1 dose for adults aged 52 years or older.  Pneumococcal 13-valent conjugate (PCV13) vaccine.** / Consult your health care provider.  Pneumococcal  polysaccharide (PPSV23) vaccine.** / 1 dose for all adults aged 74 years and older.  Meningococcal vaccine.** / Consult your health care provider.  Hepatitis A vaccine.** / Consult your health care provider.  Hepatitis B vaccine.** / Consult your health care provider.  Haemophilus influenzae type b (Hib) vaccine.** / Consult your health care provider. ** Family history and personal history of risk and conditions may change your health care provider's recommendations. Document Released: 09/10/2001 Document Revised: 11/29/2013 Document Reviewed: 12/10/2010 [employer] Patient Information 2015 [place], [place]. This information is not intended to replace advice given to you by your health care provider. Make sure you discuss any questions you have with your health care provider.

## 2014-09-08 NOTE — Progress Notes (Signed)
Subjective:     Madeline Flores is a 39 y.o. female and is here for a comprehensive physical exam. The patient reports no new problems---sees rheum for connective tissue disease and derm for photosensitivity.  History   Social History  . Marital Status: Married    Spouse Name: N/A  . Number of Children: N/A  . Years of Education: N/A   Occupational History  . Not on file.   Social History Main Topics  . Smoking status: Never Smoker   . Smokeless tobacco: Not on file  . Alcohol Use: No  . Drug Use: No  . Sexual Activity: Yes    Birth Control/ Protection: None   Other Topics Concern  . Not on file   Social History Narrative   Health Maintenance  Topic Date Due  . INFLUENZA VACCINE  09/09/2015 (Originally 02/26/2014)  . PAP SMEAR  07/05/2017  . TETANUS/TDAP  05/01/2020    The following portions of the patient's history were reviewed and updated as appropriate:  She  has a past medical history of Celiac sprue; Heart murmur; and Osteopenia. She  does not have any pertinent problems on file. She  has past surgical history that includes Cesarean section. Her family history includes Asthma in her brother; Heart disease in her mother. She  reports that she has never smoked. She does not have any smokeless tobacco history on file. She reports that she does not drink alcohol or use illicit drugs. She has a current medication list which includes the following prescription(s): hydrocortisone cream, ibuprofen, lorazepam, omega-3 fatty acids, prenatal vit-fe fumarate-fa, probiotic product, and zolpidem. Current Outpatient Prescriptions on File Prior to Visit  Medication Sig Dispense Refill  . LORazepam (ATIVAN) 0.5 MG tablet Take 0.5 mg by mouth at bedtime as needed.    . Omega-3 Fatty Acids (FISH OIL CONCENTRATE PO) Take 1 mg/mL by mouth.    . Probiotic Product (PROBIOTIC DAILY PO) Take by mouth.     No current facility-administered medications on file prior to visit.   She is  allergic to clarithromycin..  Review of Systems Review of Systems  Constitutional: Negative for activity change, appetite change and fatigue.  HENT: Negative for hearing loss, congestion, tinnitus and ear discharge.  dentist q25mEyes: Negative for visual disturbance (see optho q1y -- vision corrected to 20/20 with glasses).  Respiratory: Negative for cough, chest tightness and shortness of breath.   Cardiovascular: Negative for chest pain, palpitations and leg swelling.  Gastrointestinal: Negative for abdominal pain, diarrhea, constipation and abdominal distention.  Genitourinary: Negative for urgency, frequency, decreased urine volume and difficulty urinating.  Musculoskeletal: Negative for back pain, arthralgias and gait problem.  Skin: Negative for color change, pallor and rash.  Neurological: Negative for dizziness, light-headedness, numbness and headaches.  Hematological: Negative for adenopathy. Does not bruise/bleed easily.  Psychiatric/Behavioral: Negative for suicidal ideas, confusion, sleep disturbance, self-injury, dysphoric mood, decreased concentration and agitation.       Objective:    BP 114/70 mmHg  Pulse 90  Temp(Src) 98.3 F (36.8 C) (Oral)  Ht 5' 4.5" (1.638 m)  Wt 112 lb 9.6 oz (51.075 kg)  BMI 19.04 kg/m2  SpO2 99% General appearance: alert, cooperative, appears stated age and no distress Head: Normocephalic, without obvious abnormality, atraumatic Eyes: conjunctivae/corneas clear. PERRL, EOM's intact. Fundi benign. Ears: normal TM's and external ear canals both ears Nose: Nares normal. Septum midline. Mucosa normal. No drainage or sinus tenderness. Throat: lips, mucosa, and tongue normal; teeth and gums normal Neck: no  adenopathy, no carotid bruit, no JVD, supple, symmetrical, trachea midline and thyroid not enlarged, symmetric, no tenderness/mass/nodules Back: symmetric, no curvature. ROM normal. No CVA tenderness. Lungs: clear to auscultation  bilaterally Breasts: gyn Heart: regular rate and rhythm, S1, S2 normal, no murmur, click, rub or gallop Abdomen: soft, non-tender; bowel sounds normal; no masses,  no organomegaly Pelvic: deferred---gyn Extremities: extremities normal, atraumatic, no cyanosis or edema Pulses: 2+ and symmetric Skin: Skin color, texture, turgor normal. No rashes or lesions Lymph nodes: Cervical, supraclavicular, and axillary nodes normal. Neurologic: Alert and oriented X 3, normal strength and tone. Normal symmetric reflexes. Normal coordination and gait Psych-- no depression, no anxiety      Assessment:    Healthy female exam.       Plan:    ghm utd Check labs  See After Visit Summary for Counseling Recommendations    1. Preventative health care   - Basic metabolic panel - Hepatic function panel - Lipid panel - POCT urinalysis dipstick - TSH - Vitamin D 1,25 dihydroxy - Basic metabolic panel; Future - CBC with Differential/Platelet; Future - Hepatic function panel; Future - Lipid panel; Future - POCT urinalysis dipstick; Future - TSH; Future - Vitamin D 1,25 dihydroxy; Future  2. Connective tissue disease  - Ambulatory referral to Rheumatology  3. Photosensitivity  - Ambulatory referral to Dermatology

## 2014-09-08 NOTE — Progress Notes (Signed)
Pre visit review using our clinic review tool, if applicable. No additional management support is needed unless otherwise documented below in the visit note. 

## 2014-09-14 ENCOUNTER — Other Ambulatory Visit (INDEPENDENT_AMBULATORY_CARE_PROVIDER_SITE_OTHER): Payer: 59

## 2014-09-14 DIAGNOSIS — Z0000 Encounter for general adult medical examination without abnormal findings: Secondary | ICD-10-CM

## 2014-09-14 LAB — CBC WITH DIFFERENTIAL/PLATELET
BASOS PCT: 0.5 % (ref 0.0–3.0)
Basophils Absolute: 0 10*3/uL (ref 0.0–0.1)
Eosinophils Absolute: 0.1 10*3/uL (ref 0.0–0.7)
Eosinophils Relative: 1.4 % (ref 0.0–5.0)
HCT: 42.1 % (ref 36.0–46.0)
Hemoglobin: 14.3 g/dL (ref 12.0–15.0)
Lymphocytes Relative: 46.3 % — ABNORMAL HIGH (ref 12.0–46.0)
Lymphs Abs: 1.8 10*3/uL (ref 0.7–4.0)
MCHC: 34 g/dL (ref 30.0–36.0)
MCV: 89.8 fl (ref 78.0–100.0)
MONO ABS: 0.4 10*3/uL (ref 0.1–1.0)
Monocytes Relative: 10.9 % (ref 3.0–12.0)
NEUTROS PCT: 40.9 % — AB (ref 43.0–77.0)
Neutro Abs: 1.6 10*3/uL (ref 1.4–7.7)
Platelets: 162 10*3/uL (ref 150.0–400.0)
RBC: 4.68 Mil/uL (ref 3.87–5.11)
RDW: 13.1 % (ref 11.5–15.5)
WBC: 4 10*3/uL (ref 4.0–10.5)

## 2014-09-14 LAB — BASIC METABOLIC PANEL
BUN: 10 mg/dL (ref 6–23)
CALCIUM: 8.9 mg/dL (ref 8.4–10.5)
CHLORIDE: 105 meq/L (ref 96–112)
CO2: 25 mEq/L (ref 19–32)
CREATININE: 0.81 mg/dL (ref 0.40–1.20)
GFR: 83.58 mL/min (ref >60.00)
Glucose, Bld: 85 mg/dL (ref 70–99)
Potassium: 4 mEq/L (ref 3.5–5.1)
Sodium: 137 mEq/L (ref 135–145)

## 2014-09-14 LAB — TSH: TSH: 1.42 u[IU]/mL (ref 0.35–4.50)

## 2014-09-14 LAB — LIPID PANEL
CHOLESTEROL: 175 mg/dL (ref 0–200)
HDL: 65 mg/dL (ref >39.00)
LDL Cholesterol: 100 mg/dL — ABNORMAL HIGH (ref 0–99)
NONHDL: 110
Total CHOL/HDL Ratio: 3
Triglycerides: 51 mg/dL (ref 0.0–149.0)
VLDL: 10.2 mg/dL (ref 0.0–40.0)

## 2014-09-14 LAB — HEPATIC FUNCTION PANEL
ALK PHOS: 42 U/L (ref 39–117)
ALT: 15 U/L (ref 0–35)
AST: 21 U/L (ref 0–37)
Albumin: 4.2 g/dL (ref 3.5–5.2)
BILIRUBIN TOTAL: 0.7 mg/dL (ref 0.2–1.2)
Bilirubin, Direct: 0.1 mg/dL (ref 0.0–0.3)
Total Protein: 7 g/dL (ref 6.0–8.3)

## 2014-09-17 LAB — VITAMIN D 1,25 DIHYDROXY
VITAMIN D 1, 25 (OH) TOTAL: 40 pg/mL (ref 18–72)
VITAMIN D3 1, 25 (OH): 40 pg/mL

## 2014-09-19 ENCOUNTER — Encounter: Payer: Self-pay | Admitting: Family Medicine

## 2014-09-19 DIAGNOSIS — M545 Low back pain: Secondary | ICD-10-CM

## 2014-12-12 NOTE — Telephone Encounter (Signed)
Ok to refer to Madeline Flores--- Low back pain

## 2015-02-06 ENCOUNTER — Telehealth: Payer: Self-pay | Admitting: Family Medicine

## 2015-02-06 DIAGNOSIS — L568 Other specified acute skin changes due to ultraviolet radiation: Secondary | ICD-10-CM

## 2015-02-06 NOTE — Telephone Encounter (Signed)
Relation to pt: self  Call back [phone]   Reason for call:   Pt requesting a referral to Dr. Druscilla Brownie Dermatologist [address] [place],  [address]  Phone: [phone]

## 2015-02-06 NOTE — Telephone Encounter (Signed)
Ref placed.      KP 

## 2015-02-10 ENCOUNTER — Other Ambulatory Visit: Payer: Self-pay | Admitting: Family Medicine

## 2015-02-10 ENCOUNTER — Encounter: Payer: Self-pay | Admitting: Family Medicine

## 2015-02-10 DIAGNOSIS — M359 Systemic involvement of connective tissue, unspecified: Secondary | ICD-10-CM

## 2015-03-28 ENCOUNTER — Telehealth: Payer: Self-pay | Admitting: Family Medicine

## 2015-03-28 NOTE — Telephone Encounter (Signed)
[facility] Fax# [phone] Attn: Otila Kluver  Please refax referral for pt. It is Tuesday 04/04/15 3:00pm. Pt states they were not contracted with [employer] until a week or so ago and they needed referral sent back over as they previously informed pt they were not able to accept.

## 2015-03-28 NOTE — Telephone Encounter (Signed)
auth # [id number], faxed

## 2015-07-17 ENCOUNTER — Ambulatory Visit (INDEPENDENT_AMBULATORY_CARE_PROVIDER_SITE_OTHER): Payer: 59 | Admitting: Family Medicine

## 2015-07-17 ENCOUNTER — Encounter: Payer: Self-pay | Admitting: Family Medicine

## 2015-07-17 VITALS — BP 100/60 | HR 74 | Temp 98.3°F | Wt 111.0 lb

## 2015-07-17 DIAGNOSIS — L853 Xerosis cutis: Secondary | ICD-10-CM

## 2015-07-17 DIAGNOSIS — L03012 Cellulitis of left finger: Secondary | ICD-10-CM | POA: Diagnosis not present

## 2015-07-17 MED ORDER — AMOXICILLIN-POT CLAVULANATE 875-125 MG PO TABS: 1.0000 | ORAL_TABLET | Freq: Two times a day (BID) | ORAL | Status: DC

## 2015-07-17 MED ORDER — ELETONE EX CREA: TOPICAL_CREAM | CUTANEOUS | Status: DC

## 2015-07-17 NOTE — Progress Notes (Signed)
Patient ID: Madeline Flores, female    DOB: 09/09/1975  Age: 39 y.o. MRN: [id number]    Subjective:  Subjective HPI Madeline Flores presents for pain and tenderness L 5th finger --- no drainage.   Review of Systems  Constitutional: Negative for fever and chills.  Skin: Positive for wound.       Tenderness and reddness 5th finger L hand    History Past Medical History  Diagnosis Date  . Celiac sprue   . Heart murmur   . Osteopenia     She has past surgical history that includes Cesarean section.   Her family history includes Asthma in her brother; Heart disease in her mother.She reports that she has never smoked. She does not have any smokeless tobacco history on file. She reports that she does not drink alcohol or use illicit drugs.  Current Outpatient Prescriptions on File Prior to Visit  Medication Sig Dispense Refill  . hydrocortisone cream 1 % Apply 1 application topically 2 (two) times daily.    Marland Kitchen ibuprofen (ADVIL,MOTRIN) 200 MG tablet Take 400 mg by mouth every 6 (six) hours as needed.    Marland Kitchen LORazepam (ATIVAN) 0.5 MG tablet Take 0.5 mg by mouth at bedtime as needed.    . Omega-3 Fatty Acids (FISH OIL CONCENTRATE PO) Take 1 mg/mL by mouth.    . Prenatal Vit-Fe Fumarate-FA (PRENATAL VITAMIN PO) Take 1 tablet by mouth daily.    . Probiotic Product (PROBIOTIC DAILY PO) Take by mouth.    . zolpidem (AMBIEN) 5 MG tablet Take 5 mg by mouth at bedtime as needed for sleep.     No current facility-administered medications on file prior to visit.     Objective:  Objective Physical Exam  Constitutional: She is oriented to person, place, and time. She appears well-developed and well-nourished.  HENT:  Head: Normocephalic and atraumatic.  Eyes: Conjunctivae and EOM are normal.  Neck: Normal range of motion. Neck supple. No JVD present. Carotid bruit is not present. No thyromegaly present.  Cardiovascular: Normal rate, regular rhythm and normal heart sounds.   No murmur  heard. Pulmonary/Chest: Effort normal and breath sounds normal. No respiratory distress. She has no wheezes. She has no rales. She exhibits no tenderness.  Musculoskeletal: She exhibits no edema.       Hands: Neurological: She is alert and oriented to person, place, and time.  Psychiatric: She has a normal mood and affect.  Nursing note and vitals reviewed.  BP 100/60 mmHg  Pulse 74  Temp(Src) 98.3 F (36.8 C) (Oral)  Wt 111 lb (50.349 kg)  SpO2 97% Wt Readings from Last 3 Encounters:  07/17/15 111 lb (50.349 kg)  09/08/14 112 lb 9.6 oz (51.075 kg)  09/19/12 109 lb 12.8 oz (49.805 kg)     Lab Results  Component Value Date   WBC 4.0 09/14/2014   HGB 14.3 09/14/2014   HCT 42.1 09/14/2014   PLT 162.0 09/14/2014   GLUCOSE 85 09/14/2014   CHOL 175 09/14/2014   TRIG 51.0 09/14/2014   HDL 65.00 09/14/2014   LDLDIRECT 141.6 05/01/2010   LDLCALC 100* 09/14/2014   ALT 15 09/14/2014   AST 21 09/14/2014   NA 137 09/14/2014   K 4.0 09/14/2014   CL 105 09/14/2014   CREATININE 0.81 09/14/2014   BUN 10 09/14/2014   CO2 25 09/14/2014   TSH 1.42 09/14/2014    Mm Digital Diagnostic Bilat  06/18/2013  CLINICAL DATA:  Focal fullness and puffiness in the upper  outer left breast. Baseline mammography. EXAM: DIGITAL DIAGNOSTIC  BILATERAL MAMMOGRAM WITH CAD COMPARISON:  None. ACR Breast Density Category d: The breasts are extremely dense, which lowers the sensitivity of mammography. FINDINGS: CC and MLO views of both breasts were obtained. No findings suspicious for malignancy in either breast. Mammographic images were processed with CAD. IMPRESSION: No mammographic evidence of malignancy. RECOMMENDATION: Left breast ultrasound in the area of focal fullness and puffiness. The ultrasound was not performed today due to the patient's insurance regulations. I spoke to the patient and informed her that ultrasound should be performed, given the fact that her breasts are so dense mammographically. She  plans to call the [facility] and schedule this after speaking with her insurance company. Results were provided in writing at the conclusion of the visit and were mailed to the patient. BI-RADS CATEGORY  0: Incomplete. Need additional imaging evaluation and/or prior mammograms for comparison. Electronically Signed   By: Madeline Flores M.D.   On: 06/18/2013 11:55     Assessment & Plan:  Plan I am having Madeline Flores start on Madeline Flores and amoxicillin-clavulanate. I am also having her maintain her LORazepam, Omega-3 Fatty Acids (FISH OIL CONCENTRATE PO), Probiotic Product (PROBIOTIC DAILY PO), zolpidem, hydrocortisone cream, Prenatal Vit-Fe Fumarate-FA (PRENATAL VITAMIN PO), and ibuprofen.  Meds ordered this encounter  Medications  . Dermatological Products, Misc. (Madeline Flores) CREA    Sig: As directed    Dispense:  100 g    Refill:  5  . amoxicillin-clavulanate (Madeline Flores) 875-125 MG tablet    Sig: Take 1 tablet by mouth 2 (two) times daily.    Dispense:  20 tablet    Refill:  0    Problem List Items Addressed This Visit    None    Visit Diagnoses    Dry skin    -  Primary    Relevant Medications    Dermatological Products, Misc. (Madeline Flores) CREA    amoxicillin-clavulanate (Madeline Flores) 875-125 MG tablet    Paronychia of fifth finger of left hand        Relevant Medications    amoxicillin-clavulanate (Madeline Flores) 875-125 MG tablet       Follow-up: Return if symptoms worsen or fail to improve.  Madeline Koyanagi, DO

## 2015-07-17 NOTE — Progress Notes (Signed)
Pre visit review using our clinic review tool, if applicable. No additional management support is needed unless otherwise documented below in the visit note. 

## 2015-07-17 NOTE — Patient Instructions (Signed)
Paronychia °Paronychia is an infection of the skin that surrounds a nail. It usually affects the skin around a fingernail, but it may also occur near a toenail. It often causes pain and swelling around the nail. This condition may come on suddenly or develop over a longer period. In some cases, a collection of pus (abscess) can form near or under the nail. Usually, paronychia is not serious and it clears up with treatment. °CAUSES °This condition may be caused by bacteria or fungi. It is commonly caused by either Streptococcus or Staphylococcus bacteria. The bacteria or fungi often cause the infection by getting into the affected area through an opening in the skin, such as a cut or a hangnail. °RISK FACTORS °This condition is more likely to develop in: °· People who get their hands wet often, such as those who work as dishwashers, bartenders, or nurses. °· People who bite their fingernails or suck their thumbs. °· People who trim their nails too short. °· People who have hangnails or injured fingertips. °· People who get manicures. °· People who have diabetes. °SYMPTOMS °Symptoms of this condition include: °· Redness and swelling of the skin near the nail. °· Tenderness around the nail when you touch the area. °· Pus-filled bumps under the cuticle. The cuticle is the skin at the base or sides of the nail. °· Fluid or pus under the nail. °· Throbbing pain in the area. °DIAGNOSIS °This condition is usually diagnosed with a physical exam. In some cases, a sample of pus may be taken from an abscess to be tested in a lab. This can help to determine what type of bacteria or fungi is causing the condition. °TREATMENT °Treatment for this condition depends on the cause and severity of the condition. If the condition is mild, it may clear up on its own in a few days. Your health care provider may recommend soaking the affected area in warm water a few times a Scrivener. When treatment is needed, the options may  include: °· Antibiotic medicine, if the condition is caused by a bacterial infection. °· Antifungal medicine, if the condition is caused by a fungal infection. °· Incision and drainage, if an abscess is present. In this procedure, the health care provider will cut open the abscess so the pus can drain out. °HOME CARE INSTRUCTIONS °· Soak the affected area in warm water if directed to do so by your health care provider. You may be told to do this for 20 minutes, 2-3 times a Mcintire. Keep the area dry in between soakings. °· Take medicines only as directed by your health care provider. °· If you were prescribed an antibiotic medicine, finish all of it even if you start to feel better. °· Keep the affected area clean. °· Do not try to drain a fluid-filled bump yourself. °· If you will be washing dishes or performing other tasks that require your hands to get wet, wear rubber gloves. You should also wear gloves if your hands might come in contact with irritating substances, such as cleaners or chemicals. °· Follow your health care provider's instructions about: °¨ Wound care. °¨ Bandage (dressing) changes and removal. °SEEK MEDICAL CARE IF: °· Your symptoms get worse or do not improve with treatment. °· You have a fever or chills. °· You have redness spreading from the affected area. °· You have continued or increased fluid, blood, or pus coming from the affected area. °· Your finger or knuckle becomes swollen or is difficult to move. °  °  This information is not intended to replace advice given to you by your health care provider. Make sure you discuss any questions you have with your health care provider. °  °Document Released: 01/08/2001 Document Revised: 11/29/2014 Document Reviewed: 06/22/2014 °Elsevier Interactive Patient Education ©2016 [employer] ° °

## 2015-08-13 ENCOUNTER — Encounter: Payer: Self-pay | Admitting: Family Medicine

## 2015-08-14 ENCOUNTER — Other Ambulatory Visit: Payer: Self-pay | Admitting: Family Medicine

## 2015-08-14 DIAGNOSIS — IMO0002 Reserved for concepts with insufficient information to code with codable children: Secondary | ICD-10-CM

## 2015-08-14 MED ORDER — AMOXICILLIN-POT CLAVULANATE 875-125 MG PO TABS: 1.0000 | ORAL_TABLET | Freq: Two times a day (BID) | ORAL | Status: DC

## 2015-09-21 ENCOUNTER — Encounter: Payer: Self-pay | Admitting: Family Medicine

## 2015-09-21 DIAGNOSIS — IMO0002 Reserved for concepts with insufficient information to code with codable children: Secondary | ICD-10-CM

## 2015-09-22 MED ORDER — AMOXICILLIN-POT CLAVULANATE 875-125 MG PO TABS: 1.0000 | ORAL_TABLET | Freq: Two times a day (BID) | ORAL | Status: DC

## 2015-09-22 NOTE — Telephone Encounter (Signed)
Pt notified and made aware of provider's recommendations.  She is in agreement with plan.  Rx sent to pharmacy.  Referral placed.

## 2015-09-22 NOTE — Addendum Note (Signed)
Addended by: Rudene Anda on: 09/22/2015 11:19 AM   Modules accepted: Orders

## 2015-09-22 NOTE — Telephone Encounter (Signed)
Refill augmentin 875 mg bid x 10 days and refer to hand surgeon

## 2015-09-22 NOTE — Telephone Encounter (Signed)
Pt called since she hasn't heard back. Advised Dr. Etter Sjogren is out of the office. She is using ointment and bandaids but the same issue keeps happening with her finger. Nothing needs to be drained it is just swollen and red. She is wondering if a different provider can call in meds for her. She is seeing pt in [place] area today and cannot make it in to an appt. She is wondering if she'll need a referral to a surgeon since she's been to dermatology and the same issue keeps recurring. Please call pt at [phone].

## 2015-11-07 ENCOUNTER — Ambulatory Visit (INDEPENDENT_AMBULATORY_CARE_PROVIDER_SITE_OTHER): Payer: BLUE CROSS/BLUE SHIELD | Admitting: Family Medicine

## 2015-11-07 ENCOUNTER — Encounter: Payer: Self-pay | Admitting: Family Medicine

## 2015-11-07 VITALS — BP 104/60 | HR 82 | Temp 98.2°F | Ht 64.5 in | Wt 109.8 lb

## 2015-11-07 DIAGNOSIS — B373 Candidiasis of vulva and vagina: Secondary | ICD-10-CM | POA: Diagnosis not present

## 2015-11-07 DIAGNOSIS — B3731 Acute candidiasis of vulva and vagina: Secondary | ICD-10-CM

## 2015-11-07 DIAGNOSIS — L853 Xerosis cutis: Secondary | ICD-10-CM | POA: Diagnosis not present

## 2015-11-07 DIAGNOSIS — L03019 Cellulitis of unspecified finger: Secondary | ICD-10-CM | POA: Diagnosis not present

## 2015-11-07 DIAGNOSIS — IMO0002 Reserved for concepts with insufficient information to code with codable children: Secondary | ICD-10-CM

## 2015-11-07 MED ORDER — FLUCONAZOLE 150 MG PO TABS: ORAL_TABLET | ORAL | Status: DC

## 2015-11-07 MED ORDER — ELETONE EX CREA: TOPICAL_CREAM | CUTANEOUS | Status: DC

## 2015-11-07 NOTE — Progress Notes (Signed)
Patient ID: Madeline Flores, female    DOB: [birth year]  Age: 40 y.o. MRN: [id number]    Subjective:  Subjective HPI Mikhaela A Henault presents for f/u recurrent paronoychia in both hands.    Review of Systems  Constitutional: Negative for diaphoresis, appetite change, fatigue and unexpected weight change.  Eyes: Negative for pain, redness and visual disturbance.  Respiratory: Negative for cough, chest tightness, shortness of breath and wheezing.   Cardiovascular: Negative for chest pain, palpitations and leg swelling.  Endocrine: Negative for cold intolerance, heat intolerance, polydipsia, polyphagia and polyuria.  Genitourinary: Negative for dysuria, frequency and difficulty urinating.  Neurological: Negative for dizziness, light-headedness, numbness and headaches.    History Past Medical History  Diagnosis Date  . Celiac sprue   . Heart murmur   . Osteopenia     She has past surgical history that includes Cesarean section.   Her family history includes Asthma in her brother; Heart disease in her mother.She reports that she has never smoked. She does not have any smokeless tobacco history on file. She reports that she does not drink alcohol or use illicit drugs.  Current Outpatient Prescriptions on File Prior to Visit  Medication Sig Dispense Refill  . hydrocortisone cream 1 % Apply 1 application topically 2 (two) times daily.    Marland Kitchen ibuprofen (ADVIL,MOTRIN) 200 MG tablet Take 400 mg by mouth every 6 (six) hours as needed.    Marland Kitchen LORazepam (ATIVAN) 0.5 MG tablet Take 0.5 mg by mouth at bedtime as needed.    . Omega-3 Fatty Acids (FISH OIL CONCENTRATE PO) Take 1 mg/mL by mouth.    . Prenatal Vit-Fe Fumarate-FA (PRENATAL VITAMIN PO) Take 1 tablet by mouth daily.    . Probiotic Product (PROBIOTIC DAILY PO) Take by mouth.    . zolpidem (AMBIEN) 5 MG tablet Take 5 mg by mouth at bedtime as needed for sleep.    Marland Kitchen amoxicillin-clavulanate (AUGMENTIN) 875-125 MG tablet Take 1 tablet by mouth 2  (two) times daily. Take medication for 10 days. (Patient not taking: Reported on 11/07/2015) 20 tablet 0   No current facility-administered medications on file prior to visit.     Objective:  Objective Physical Exam  Constitutional: She is oriented to person, place, and time. She appears well-developed and well-nourished.  Musculoskeletal:       Hands: Neurological: She is alert and oriented to person, place, and time.  Psychiatric: She has a normal mood and affect. Her behavior is normal. Judgment and thought content normal.  Nursing note and vitals reviewed.  BP 104/60 mmHg  Pulse 82  Temp(Src) 98.2 F (36.8 C) (Oral)  Ht 5' 4.5" (1.638 m)  Wt 109 lb 12.8 oz (49.805 kg)  BMI 18.56 kg/m2  SpO2 97%  LMP 11/06/2015 (Exact Date) Wt Readings from Last 3 Encounters:  11/07/15 109 lb 12.8 oz (49.805 kg)  07/17/15 111 lb (50.349 kg)  09/08/14 112 lb 9.6 oz (51.075 kg)     Lab Results  Component Value Date   WBC 4.0 09/14/2014   HGB 14.3 09/14/2014   HCT 42.1 09/14/2014   PLT 162.0 09/14/2014   GLUCOSE 85 09/14/2014   CHOL 175 09/14/2014   TRIG 51.0 09/14/2014   HDL 65.00 09/14/2014   LDLDIRECT 141.6 05/01/2010   LDLCALC 100* 09/14/2014   ALT 15 09/14/2014   AST 21 09/14/2014   NA 137 09/14/2014   K 4.0 09/14/2014   CL 105 09/14/2014   CREATININE 0.81 09/14/2014   BUN 10 09/14/2014  CO2 25 09/14/2014   TSH 1.42 09/14/2014    Mm Digital Diagnostic Bilat  06/18/2013  CLINICAL DATA:  Focal fullness and puffiness in the upper outer left breast. Baseline mammography. EXAM: DIGITAL DIAGNOSTIC  BILATERAL MAMMOGRAM WITH CAD COMPARISON:  None. ACR Breast Density Category d: The breasts are extremely dense, which lowers the sensitivity of mammography. FINDINGS: CC and MLO views of both breasts were obtained. No findings suspicious for malignancy in either breast. Mammographic images were processed with CAD. IMPRESSION: No mammographic evidence of malignancy. RECOMMENDATION:  Left breast ultrasound in the area of focal fullness and puffiness. The ultrasound was not performed today due to the patient's insurance regulations. I spoke to the patient and informed her that ultrasound should be performed, given the fact that her breasts are so dense mammographically. She plans to call the [facility] and schedule this after speaking with her insurance company. Results were provided in writing at the conclusion of the visit and were mailed to the patient. BI-RADS CATEGORY  0: Incomplete. Need additional imaging evaluation and/or prior mammograms for comparison. Electronically Signed   By: Evangeline Dakin M.D.   On: 06/18/2013 11:55     Assessment & Plan:  Plan I am having Ms. Ferrufino start on fluconazole. I am also having her maintain her LORazepam, Omega-3 Fatty Acids (FISH OIL CONCENTRATE PO), Probiotic Product (PROBIOTIC DAILY PO), zolpidem, hydrocortisone cream, Prenatal Vit-Fe Fumarate-FA (PRENATAL VITAMIN PO), ibuprofen, amoxicillin-clavulanate, doxycycline, CITRANATAL ASSURE, and ELETONE.  Meds ordered this encounter  Medications  . doxycycline (VIBRAMYCIN) 100 MG capsule    Sig:     Refill:  0  . Prenat w/o A-FeCbGl-DSS-FA-DHA (CITRANATAL ASSURE) 35-1 & 300 MG tablet    Sig: TK 1 PACKET QD FOR 30 DAYS    Refill:  4  . Dermatological Products, Misc. (ELETONE) CREA    Sig: As directed    Dispense:  100 g    Refill:  5  . fluconazole (DIFLUCAN) 150 MG tablet    Sig: 1 po qd x1 , may repeat in 3 days    Dispense:  2 tablet    Refill:  2    Problem List Items Addressed This Visit      Unprioritized   Paronychia - Primary    Doxycycline       Relevant Medications   fluconazole (DIFLUCAN) 150 MG tablet    Other Visit Diagnoses    Dry skin        Relevant Medications    Dermatological Products, Misc. (ELETONE) CREA    Vaginal yeast infection        Relevant Medications    fluconazole (DIFLUCAN) 150 MG tablet       Follow-up:  Return if symptoms worsen or fail to improve.  Ann Held, DO

## 2015-11-07 NOTE — Patient Instructions (Signed)
Paronychia °Paronychia is an infection of the skin that surrounds a nail. It usually affects the skin around a fingernail, but it may also occur near a toenail. It often causes pain and swelling around the nail. This condition may come on suddenly or develop over a longer period. In some cases, a collection of pus (abscess) can form near or under the nail. Usually, paronychia is not serious and it clears up with treatment. °CAUSES °This condition may be caused by bacteria or fungi. It is commonly caused by either Streptococcus or Staphylococcus bacteria. The bacteria or fungi often cause the infection by getting into the affected area through an opening in the skin, such as a cut or a hangnail. °RISK FACTORS °This condition is more likely to develop in: °· People who get their hands wet often, such as those who work as dishwashers, bartenders, or nurses. °· People who bite their fingernails or suck their thumbs. °· People who trim their nails too short. °· People who have hangnails or injured fingertips. °· People who get manicures. °· People who have diabetes. °SYMPTOMS °Symptoms of this condition include: °· Redness and swelling of the skin near the nail. °· Tenderness around the nail when you touch the area. °· Pus-filled bumps under the cuticle. The cuticle is the skin at the base or sides of the nail. °· Fluid or pus under the nail. °· Throbbing pain in the area. °DIAGNOSIS °This condition is usually diagnosed with a physical exam. In some cases, a sample of pus may be taken from an abscess to be tested in a lab. This can help to determine what type of bacteria or fungi is causing the condition. °TREATMENT °Treatment for this condition depends on the cause and severity of the condition. If the condition is mild, it may clear up on its own in a few days. Your health care provider may recommend soaking the affected area in warm water a few times a Bergeman. When treatment is needed, the options may  include: °· Antibiotic medicine, if the condition is caused by a bacterial infection. °· Antifungal medicine, if the condition is caused by a fungal infection. °· Incision and drainage, if an abscess is present. In this procedure, the health care provider will cut open the abscess so the pus can drain out. °HOME CARE INSTRUCTIONS °· Soak the affected area in warm water if directed to do so by your health care provider. You may be told to do this for 20 minutes, 2-3 times a Golson. Keep the area dry in between soakings. °· Take medicines only as directed by your health care provider. °· If you were prescribed an antibiotic medicine, finish all of it even if you start to feel better. °· Keep the affected area clean. °· Do not try to drain a fluid-filled bump yourself. °· If you will be washing dishes or performing other tasks that require your hands to get wet, wear rubber gloves. You should also wear gloves if your hands might come in contact with irritating substances, such as cleaners or chemicals. °· Follow your health care provider's instructions about: °¨ Wound care. °¨ Bandage (dressing) changes and removal. °SEEK MEDICAL CARE IF: °· Your symptoms get worse or do not improve with treatment. °· You have a fever or chills. °· You have redness spreading from the affected area. °· You have continued or increased fluid, blood, or pus coming from the affected area. °· Your finger or knuckle becomes swollen or is difficult to move. °  °  This information is not intended to replace advice given to you by your health care provider. Make sure you discuss any questions you have with your health care provider. °  °Document Released: 01/08/2001 Document Revised: 11/29/2014 Document Reviewed: 06/22/2014 °Elsevier Interactive Patient Education ©2016 [employer] ° °

## 2015-11-07 NOTE — Progress Notes (Signed)
Pre visit review using our clinic review tool, if applicable. No additional management support is needed unless otherwise documented below in the visit note. 

## 2015-11-09 DIAGNOSIS — L03019 Cellulitis of unspecified finger: Secondary | ICD-10-CM | POA: Insufficient documentation

## 2015-11-09 DIAGNOSIS — IMO0002 Reserved for concepts with insufficient information to code with codable children: Secondary | ICD-10-CM | POA: Insufficient documentation

## 2015-11-09 NOTE — Assessment & Plan Note (Signed)
Doxycycline

## 2016-01-23 ENCOUNTER — Encounter: Payer: Self-pay | Admitting: Family Medicine

## 2016-01-23 NOTE — Telephone Encounter (Signed)
Spoke with patient and she said she can come in on Friday. Apt scheduled at 51 with Dr.Lowne.   KP

## 2016-01-26 ENCOUNTER — Ambulatory Visit (INDEPENDENT_AMBULATORY_CARE_PROVIDER_SITE_OTHER): Payer: BLUE CROSS/BLUE SHIELD | Admitting: Family Medicine

## 2016-01-26 VITALS — BP 123/76 | HR 77 | Temp 99.2°F | Ht 65.0 in | Wt 111.2 lb

## 2016-01-26 DIAGNOSIS — R21 Rash and other nonspecific skin eruption: Secondary | ICD-10-CM

## 2016-01-26 NOTE — Patient Instructions (Signed)

## 2016-01-28 ENCOUNTER — Encounter: Payer: Self-pay | Admitting: Family Medicine

## 2016-01-28 NOTE — Progress Notes (Signed)
Patient ID: Madeline Flores, female    DOB: 1976-05-01  Age: 40 y.o. MRN: [id number]    Subjective:  Subjective-- HPI Madeline Flores presents for c/o irritation around rectum--- she is using otc cortisone  Review of Systems  Constitutional: Negative for diaphoresis, appetite change, fatigue and unexpected weight change.  Eyes: Negative for pain, redness and visual disturbance.  Respiratory: Negative for cough, chest tightness, shortness of breath and wheezing.   Cardiovascular: Negative for chest pain, palpitations and leg swelling.  Endocrine: Negative for cold intolerance, heat intolerance, polydipsia, polyphagia and polyuria.  Genitourinary: Negative for dysuria, frequency and difficulty urinating.  Skin: Positive for rash.  Neurological: Negative for dizziness, light-headedness, numbness and headaches.    History Past Medical History  Diagnosis Date  . Celiac sprue   . Heart murmur   . Osteopenia     She has past surgical history that includes Cesarean section.   Her family history includes Asthma in her brother; Heart disease in her mother.She reports that she has never smoked. She does not have any smokeless tobacco history on file. She reports that she does not drink alcohol or use illicit drugs.  Current Outpatient Prescriptions on File Prior to Visit  Medication Sig Dispense Refill  . ibuprofen (ADVIL,MOTRIN) 200 MG tablet Take 400 mg by mouth every 6 (six) hours as needed.    Marland Kitchen LORazepam (ATIVAN) 0.5 MG tablet Take 0.5 mg by mouth at bedtime as needed.    . Omega-3 Fatty Acids (FISH OIL CONCENTRATE PO) Take 1 mg/mL by mouth.    . Prenat w/o A-FeCbGl-DSS-FA-DHA (CITRANATAL ASSURE) 35-1 & 300 MG tablet TK 1 PACKET QD FOR 30 DAYS  4  . Probiotic Product (PROBIOTIC DAILY PO) Take by mouth.    . zolpidem (AMBIEN) 5 MG tablet Take 5 mg by mouth at bedtime as needed for sleep.    . Dermatological Products, Misc. (ELETONE) CREA As directed 100 g 5  . Prenatal Vit-Fe Fumarate-FA  (PRENATAL VITAMIN PO) Take 1 tablet by mouth daily. Reported on 01/26/2016     No current facility-administered medications on file prior to visit.     Objective:  Objective Physical Exam  Skin:     Psychiatric: She has a normal mood and affect. Her behavior is normal. Judgment and thought content normal.  Nursing note and vitals reviewed.  BP 123/76 mmHg  Pulse 77  Temp(Src) 99.2 F (37.3 C) (Oral)  Ht 5' 5"  (1.651 m)  Wt 111 lb 3.2 oz (50.44 kg)  BMI 18.50 kg/m2  SpO2 100%  LMP 01/05/2016 Wt Readings from Last 3 Encounters:  01/26/16 111 lb 3.2 oz (50.44 kg)  11/07/15 109 lb 12.8 oz (49.805 kg)  07/17/15 111 lb (50.349 kg)     Lab Results  Component Value Date   WBC 4.0 09/14/2014   HGB 14.3 09/14/2014   HCT 42.1 09/14/2014   PLT 162.0 09/14/2014   GLUCOSE 85 09/14/2014   CHOL 175 09/14/2014   TRIG 51.0 09/14/2014   HDL 65.00 09/14/2014   LDLDIRECT 141.6 05/01/2010   LDLCALC 100* 09/14/2014   ALT 15 09/14/2014   AST 21 09/14/2014   NA 137 09/14/2014   K 4.0 09/14/2014   CL 105 09/14/2014   CREATININE 0.81 09/14/2014   BUN 10 09/14/2014   CO2 25 09/14/2014   TSH 1.42 09/14/2014    Mm Digital Diagnostic Bilat  06/18/2013  CLINICAL DATA:  Focal fullness and puffiness in the upper outer left breast. Baseline mammography. EXAM: DIGITAL  DIAGNOSTIC  BILATERAL MAMMOGRAM WITH CAD COMPARISON:  None. ACR Breast Density Category d: The breasts are extremely dense, which lowers the sensitivity of mammography. FINDINGS: CC and MLO views of both breasts were obtained. No findings suspicious for malignancy in either breast. Mammographic images were processed with CAD. IMPRESSION: No mammographic evidence of malignancy. RECOMMENDATION: Left breast ultrasound in the area of focal fullness and puffiness. The ultrasound was not performed today due to the patient's insurance regulations. I spoke to the patient and informed her that ultrasound should be performed, given the fact  that her breasts are so dense mammographically. She plans to call the [facility] and schedule this after speaking with her insurance company. Results were provided in writing at the conclusion of the visit and were mailed to the patient. BI-RADS CATEGORY  0: Incomplete. Need additional imaging evaluation and/or prior mammograms for comparison. Electronically Signed   By: Evangeline Dakin M.D.   On: 06/18/2013 11:55     Assessment & Plan:  Plan I have discontinued Ms. Yearwood's amoxicillin-clavulanate, doxycycline, and fluconazole. I am also having her maintain her LORazepam, Omega-3 Fatty Acids (FISH OIL CONCENTRATE PO), Probiotic Product (PROBIOTIC DAILY PO), zolpidem, Prenatal Vit-Fe Fumarate-FA (PRENATAL VITAMIN PO), ibuprofen, CITRANATAL ASSURE, ELETONE, and hydrocortisone cream.  Meds ordered this encounter  Medications  . hydrocortisone cream 0.5 %    Sig: Apply 1 application topically 2 (two) times daily.    Dispense:  30 g    Refill:  0    Problem List Items Addressed This Visit    None    Visit Diagnoses    Rash and nonspecific skin eruption    -  Primary    Relevant Medications    hydrocortisone cream 0.5 %       Follow-up: Return if symptoms worsen or fail to improve.  Ann Held, DO

## 2016-06-17 ENCOUNTER — Encounter (HOSPITAL_COMMUNITY): Payer: Self-pay | Admitting: Obstetrics and Gynecology

## 2016-06-17 ENCOUNTER — Other Ambulatory Visit: Payer: Self-pay | Admitting: Obstetrics and Gynecology

## 2016-07-03 ENCOUNTER — Encounter (HOSPITAL_COMMUNITY): Payer: Self-pay | Admitting: *Deleted

## 2016-07-04 ENCOUNTER — Other Ambulatory Visit (HOSPITAL_COMMUNITY): Payer: Self-pay | Admitting: *Deleted

## 2016-07-04 ENCOUNTER — Ambulatory Visit (HOSPITAL_COMMUNITY)
Admission: RE | Admit: 2016-07-04 | Discharge: 2016-07-04 | Disposition: A | Discharge status: Home / Self Care | Payer: BLUE CROSS/BLUE SHIELD | Source: Ambulatory Visit | Attending: Obstetrics and Gynecology | Admitting: Obstetrics and Gynecology

## 2016-07-04 ENCOUNTER — Encounter (HOSPITAL_COMMUNITY): Payer: Self-pay

## 2016-07-04 DIAGNOSIS — Z315 Encounter for genetic counseling: Secondary | ICD-10-CM | POA: Diagnosis not present

## 2016-07-04 DIAGNOSIS — O09529 Supervision of elderly multigravida, unspecified trimester: Secondary | ICD-10-CM | POA: Insufficient documentation

## 2016-07-04 DIAGNOSIS — O09522 Supervision of elderly multigravida, second trimester: Secondary | ICD-10-CM

## 2016-07-04 DIAGNOSIS — L949 Localized connective tissue disorder, unspecified: Secondary | ICD-10-CM | POA: Diagnosis not present

## 2016-07-04 DIAGNOSIS — O09521 Supervision of elderly multigravida, first trimester: Secondary | ICD-10-CM | POA: Insufficient documentation

## 2016-07-04 DIAGNOSIS — Z3A10 10 weeks gestation of pregnancy: Secondary | ICD-10-CM | POA: Diagnosis not present

## 2016-07-04 NOTE — Consult Note (Signed)
Maternal Fetal Medicine Consultation  Requesting Provider(s): Freda Munro, MD  Reason for consultation: History of undifferentiated connective tissue disorder  HPI: Madeline Flores is a 40 yo G2P1001, EDD 01/30/2017 who is currently at 34w0dseen for consultation due to a history of undifferentiated connective tissue disorder. The reports that the diagnosis was made approximately 15 years ago - manifested by Raynaud's phenomena, photosensitivity, fatigue and joint pain.  Her most recent ANA was 1:1228.  She denies any known kidney involvement.  Her SSA and SSB were negative in 2016 and she reports that she had testing for antiphospholipids within the last several years that were also negative.  She reports that she was treated with Plaquenil for approximately 6 months but did not notice any changes in her symptoms and elected to discontinue it.  With the exception of a brief course of Plaquenil, she has not required any additional medical management.  Ms. Pellman had a previous term C-section for breech presentation without complications.  She did not have any hypertensive disorders of pregnancy with that gestation.  She is without complaints today.  OB History: OB History    Gravida Para Term Preterm AB Living   2 1 1     1    SAB TAB Ectopic Multiple Live Births                  PMH:  Past Medical History:  Diagnosis Date  . Celiac sprue   . Heart murmur   . Osteopenia     PSH:  Past Surgical History:  Procedure Laterality Date  . CESAREAN SECTION    . KNEE ARTHROSCOPY    . WISDOM TOOTH EXTRACTION     Meds:  Current Outpatient Prescriptions on File Prior to Encounter  Medication Sig Dispense Refill  . Dermatological Products, Misc. (ELETONE) CREA As directed 100 g 5  . hydrocortisone cream 0.5 % Apply 1 application topically 2 (two) times daily. 30 g 0  . Prenat w/o A-FeCbGl-DSS-FA-DHA (CITRANATAL ASSURE) 35-1 & 300 MG tablet TK 1 PACKET QD FOR 30 DAYS  4  . Probiotic Product  (PROBIOTIC DAILY PO) Take by mouth.    .Marland Kitchenibuprofen (ADVIL,MOTRIN) 200 MG tablet Take 400 mg by mouth every 6 (six) hours as needed.    .Marland KitchenLORazepam (ATIVAN) 0.5 MG tablet Take 0.5 mg by mouth at bedtime as needed.    . Omega-3 Fatty Acids (FISH OIL CONCENTRATE PO) Take 1 mg/mL by mouth.    . Prenatal Vit-Fe Fumarate-FA (PRENATAL VITAMIN PO) Take 1 tablet by mouth daily. Reported on 01/26/2016    . zolpidem (AMBIEN) 5 MG tablet Take 5 mg by mouth at bedtime as needed for sleep.     No current facility-administered medications on file prior to encounter.     Allergies:  Allergies  Allergen Reactions  . Clarithromycin     REACTION: UPSET STOMACH  . Gluten Meal    FH:  Family History  Problem Relation Age of Onset  . Heart disease Mother   . Asthma Brother    Soc:  Social History   Social History  . Marital status: Married    Spouse name: N/A  . Number of children: N/A  . Years of education: N/A   Occupational History  . Not on file.   Social History Main Topics  . Smoking status: Never Smoker  . Smokeless tobacco: Never Used  . Alcohol use No  . Drug use: No  . Sexual activity: Yes  Birth control/ protection: None   Other Topics Concern  . Not on file   Social History Narrative  . No narrative on file    Review of Systems: no vaginal bleeding or cramping/contractions, no LOF, no nausea/vomiting. All other systems reviewed and are negative.  PE:   Vitals:   07/04/16 0908  BP: 117/71  Pulse: 78     A/P: 1) Single IUP at 10w 0d  2) Advanced maternal age > 22 - see separate not from Dietitian.  The patient elected to undergo cell free fetal DNA testing.  Recommend detailed fetal ultrasound at ~ 18 weeks.  I have tentatively scheduled her to return for this study with MFM.  She will need antenatal testing beginning no later than [redacted] weeks gestation and would recommend delivery by EDD due to increased risk of late gestational age stillbirth in women over  the age of 41.  2) History of connective tissue disorder - the patient reports that this has been stable for > 15 years and has not required medical management.  SSA /SSB and antiphospholipid antibodies have been tested within the last 2 years and were both negative.  She has no know kidney involvement.    Recommendations: 1) Recommend baseline 24-hr urine protein and preeclampsia labs 2) Given that the patient was tested from antiphospholipids as well as SSA/SSB antibodies within the last 2 years, do not feel that these labs need to be repeated 3) While the patient does not have any history of preeclampsia, would anticipate at least some increased risk due to her diagnosis of connective tissue disorder and her age.  While we did not specifically address this issue during our consultation, would consider offering daily baby aspirin (81 mg) beginning at [redacted] weeks gestation. 4) Detailed ultrasound at 18 weeks- tentatively scheduled 5) Would recommend serial ultrasounds for growth in the 3rd trimester 6) Antenatal testing beginning no later than [redacted] weeks gestation (AMA > 40).  Would start testing at 32 weeks if lagging growth is noted on screening ultrasounds in the 3rd trimester 7) Delivery by EDD  Thank you for the opportunity to be a part of the care of Madeline Flores. Please contact our office if we can be of further assistance.   I spent approximately 30 minutes with this patient with over 50% of time spent in face-to-face counseling.  Benjaman Lobe, MD Maternal Fetal Medicine

## 2016-07-04 NOTE — Progress Notes (Signed)
Genetic Counseling  High-Risk Gestation Note  Appointment Date:  07/04/2016 Referred By: Olga Millers, MD Date of Birth:  1976-01-14   Pregnancy History: G2P1001 Estimated Date of Delivery: 01/30/17 Estimated Gestational Age: 67w0dAttending: PBenjaman Lobe MD   Mrs. MJosalynA Burnley was seen for genetic counseling because of a maternal age of 89y.o.. She was accompanied by a friend to today's visit. She was also seen for MFM consultation today, documented separately.      In summary:  Discussed AMA and associated risk for fetal aneuploidy  Discussed options for screening  First screen- declined  Quad screen-declined  NIPS-elected to pursue today (Panorama)  Ultrasound- NT ultrasound available at approximately [redacted] weeks gestation and anatomy in second trimester, patient will discuss with her OB regarding scheduling  Discussed diagnostic testing options  CVS-declined  Amniocentesis-declined  Reviewed family history concerns  Discussed carrier screening options - declined  CF  SMA  Hemoglobinopathies  She was counseled regarding maternal age and the association with risk for chromosome conditions due to nondisjunction with aging of the ova.   We reviewed chromosomes, nondisjunction, and the associated 1 in 223risk for fetal aneuploidy related to a maternal age of 40y.o. at 155w0destation.  She was counseled that the risk for aneuploidy decreases as gestational age increases, accounting for those pregnancies which spontaneously abort.  We specifically discussed Down syndrome (trisomy 2154 trisomies 1336nd 1873and sex chromosome aneuploidies (47,XXX and 47,XXY) including the common features and prognoses of each.   We reviewed available screening options including First Screen, Quad screen, noninvasive prenatal screening (NIPS)/cell free DNA (cfDNA) screening, and detailed ultrasound.  She was counseled that screening tests are used to modify a patient's a priori risk for  aneuploidy, typically based on age. This estimate provides a pregnancy specific risk assessment. We reviewed the benefits and limitations of each option. Specifically, we discussed the conditions for which each test screens, the detection rates, and false positive rates of each. She was also counseled regarding diagnostic testing via CVS and amniocentesis. We reviewed the approximate 1 in 10353isk for complications from CVS and the approximate 1 in 30614-431isk for complications from amniocentesis, including spontaneous pregnancy loss. We discussed the possible results that the tests might provide including: positive, negative, unanticipated, and no result. Finally, they were counseled regarding the cost of each option and potential out of pocket expenses. After consideration of all the options, she elected to proceed with NIPS ([facility] today. She declined microdeletion assessment on NIPS today.  Those results will be available in 8-10 days.  She declined CVS and amniocentesis at this time.   We discussed that a nuchal translucency ultrasound is available at approximately [redacted] weeks gestation, if desired, and detailed anatomy ultrasound in the second trimester. No ultrasounds were scheduled at this time. Mrs. Bohnet planned to discuss these ultrasounds with her OB. She understands that screening tests cannot rule out all birth defects or genetic syndromes. The patient was advised of this limitation and states she still does not want additional testing at this time.   Mrs. MeCharlee Bucaro was provided with written information regarding cystic fibrosis (CF), spinal muscular atrophy (SMA) and hemoglobinopathies including the carrier frequency, availability of carrier screening and prenatal diagnosis if indicated.  In addition, we discussed that CF and hemoglobinopathies are routinely screened for as part of the [place] newborn screening panel.  After further discussion, she declined screening for  CF, SMA and hemoglobinopathies at  this time.  Both family histories were reviewed and found to be noncontributory for birth defects, intellectual disability, and known genetic conditions. Without further information regarding the provided family history, an accurate genetic risk cannot be calculated. Further genetic counseling is warranted if more information is obtained.  Mrs. Ellenberger denied exposure to environmental toxins or chemical agents. She denied the use of alcohol, tobacco or street drugs. She reported having a cold with a low grade fever at approximately [redacted] weeks gestation, prior to being aware of the pregnancy. Available animal and human study data on elevated maternal body temperature (hyperthermia) during pregnancy suggest an association with increased risk for birth defects, particularly open neural tube defects when the maternal body temperature is at or above 102 degrees F. The all-or-none period was discussed, meaning exposures that occur in the first 4 weeks of gestation are typically thought to either not affect the pregnancy at all or result in a miscarriage. Given the timing of Mrs. Micalizzi fever and the reported degree likely less than 102 F, the chance for these particular type of birth defects would not likely be increased above the general population risk. She denied significant viral illnesses during the course of her pregnancy. Her medical and surgical histories were contributory for psoriasis, celiac disease and possible Lupus. See separate MFM consultation note from today's visit for detailed discussion regarding patient's medical history.    I counseled Mrs. Kenzlee A Faron regarding the above risks and available options.  The approximate face-to-face time with the genetic counselor was 45 minutes.  Chipper Oman, MS,  Certified Genetic Counselor 07/04/2016

## 2016-07-05 ENCOUNTER — Other Ambulatory Visit (HOSPITAL_COMMUNITY): Payer: Self-pay

## 2016-07-10 ENCOUNTER — Other Ambulatory Visit: Payer: Self-pay | Admitting: Obstetrics and Gynecology

## 2016-07-11 ENCOUNTER — Telehealth (HOSPITAL_COMMUNITY): Payer: Self-pay | Admitting: MS"

## 2016-07-11 NOTE — Telephone Encounter (Signed)
Attempted to contact patient regarding results of noninvasive prenatal screening (Panorama), which are within normal limits. Left message for patient to return call.   Madeline Flores 07/11/2016  9:32 AM

## 2016-07-11 NOTE — Telephone Encounter (Signed)
Called [name] to discuss her prenatal cell free DNA test results.  [name] had Panorama testing through [facility].  Testing was offered because of maternal age.   The patient was identified by name and DOB.  We reviewed that these are within normal limits, showing a less than 1 in 10,000 risk for trisomies 21, 18 and 13, and monosomy X (Turner syndrome).  In addition, the risk for triploidy and sex chromosome trisomies (47,XXX and 47,XXY) was also low risk. We reviewed that this testing identifies > 99% of pregnancies with trisomy 56, trisomy 66, sex chromosome trisomies (47,XXX and 47,XXY), and triploidy. The detection rate for trisomy 18 is 96%.  The detection rate for monosomy X is ~92%.  The false positive rate is <0.1% for all conditions. Testing was also consistent with female fetal sex.  The patient did wish to know fetal sex.  She understands that this testing does not identify all genetic conditions. She indicated that she is not likely considering CVS or amniocentesis at this time given these results.  All questions were answered to her satisfaction, she was encouraged to call with additional questions or concerns.  Chipper Oman, MS Insurance risk surveyor

## 2016-07-16 LAB — OB RESULTS CONSOLE ANTIBODY SCREEN: Antibody Screen: NEGATIVE

## 2016-07-16 LAB — OB RESULTS CONSOLE GC/CHLAMYDIA
CHLAMYDIA, DNA PROBE: NEGATIVE
Gonorrhea: NEGATIVE

## 2016-07-16 LAB — OB RESULTS CONSOLE RUBELLA ANTIBODY, IGM: RUBELLA: IMMUNE

## 2016-07-16 LAB — OB RESULTS CONSOLE HEPATITIS B SURFACE ANTIGEN: HEP B S AG: NEGATIVE

## 2016-07-16 LAB — OB RESULTS CONSOLE ABO/RH: RH TYPE: POSITIVE

## 2016-07-16 LAB — OB RESULTS CONSOLE HIV ANTIBODY (ROUTINE TESTING): HIV: NONREACTIVE

## 2016-07-16 LAB — OB RESULTS CONSOLE RPR: RPR: NONREACTIVE

## 2016-08-05 DIAGNOSIS — L949 Localized connective tissue disorder, unspecified: Secondary | ICD-10-CM | POA: Diagnosis not present

## 2016-08-29 ENCOUNTER — Ambulatory Visit (HOSPITAL_COMMUNITY): Payer: BLUE CROSS/BLUE SHIELD

## 2016-08-30 ENCOUNTER — Ambulatory Visit (HOSPITAL_COMMUNITY)
Admission: RE | Admit: 2016-08-30 | Discharge: 2016-08-30 | Disposition: A | Discharge status: Home / Self Care | Payer: BLUE CROSS/BLUE SHIELD | Source: Ambulatory Visit | Attending: Obstetrics and Gynecology | Admitting: Obstetrics and Gynecology

## 2016-08-30 ENCOUNTER — Encounter (HOSPITAL_COMMUNITY): Payer: Self-pay

## 2016-08-30 ENCOUNTER — Other Ambulatory Visit (HOSPITAL_COMMUNITY): Payer: Self-pay | Admitting: Maternal and Fetal Medicine

## 2016-08-30 DIAGNOSIS — Z363 Encounter for antenatal screening for malformations: Secondary | ICD-10-CM

## 2016-08-30 DIAGNOSIS — O09522 Supervision of elderly multigravida, second trimester: Secondary | ICD-10-CM

## 2016-08-30 DIAGNOSIS — Z3A18 18 weeks gestation of pregnancy: Secondary | ICD-10-CM

## 2016-08-30 DIAGNOSIS — O09519 Supervision of elderly primigravida, unspecified trimester: Secondary | ICD-10-CM

## 2016-09-12 DIAGNOSIS — O26819 Pregnancy related exhaustion and fatigue, unspecified trimester: Secondary | ICD-10-CM | POA: Diagnosis not present

## 2016-09-27 ENCOUNTER — Encounter (HOSPITAL_COMMUNITY): Payer: Self-pay

## 2016-09-27 ENCOUNTER — Ambulatory Visit (HOSPITAL_COMMUNITY)
Admission: RE | Admit: 2016-09-27 | Discharge: 2016-09-27 | Disposition: A | Discharge status: Home / Self Care | Payer: BLUE CROSS/BLUE SHIELD | Source: Ambulatory Visit | Attending: Obstetrics and Gynecology | Admitting: Obstetrics and Gynecology

## 2016-09-27 DIAGNOSIS — Z362 Encounter for other antenatal screening follow-up: Secondary | ICD-10-CM | POA: Diagnosis not present

## 2016-09-27 DIAGNOSIS — O09522 Supervision of elderly multigravida, second trimester: Secondary | ICD-10-CM

## 2016-09-27 DIAGNOSIS — Z3A22 22 weeks gestation of pregnancy: Secondary | ICD-10-CM | POA: Diagnosis not present

## 2016-10-10 DIAGNOSIS — Z3480 Encounter for supervision of other normal pregnancy, unspecified trimester: Secondary | ICD-10-CM | POA: Diagnosis not present

## 2016-10-21 ENCOUNTER — Encounter: Payer: BLUE CROSS/BLUE SHIELD | Attending: Obstetrics and Gynecology | Admitting: Skilled Nursing Facility1

## 2016-10-21 DIAGNOSIS — O99810 Abnormal glucose complicating pregnancy: Secondary | ICD-10-CM | POA: Diagnosis not present

## 2016-10-21 DIAGNOSIS — Z713 Dietary counseling and surveillance: Secondary | ICD-10-CM | POA: Insufficient documentation

## 2016-10-21 DIAGNOSIS — Z3A00 Weeks of gestation of pregnancy not specified: Secondary | ICD-10-CM | POA: Diagnosis not present

## 2016-10-22 ENCOUNTER — Encounter: Payer: Self-pay | Admitting: Skilled Nursing Facility1

## 2016-10-22 NOTE — Progress Notes (Signed)
  Patient was seen on 10/22/2016 for Gestational Diabetes self-management class at [facility]. The following learning objectives were met by the patient during this course: Pt wrote in she was following a celiac disease diet on her intake form but this was not brought up in class and she ate a granola bar in class.   States the definition of Gestational Diabetes  States why dietary management is important in controlling blood glucose  Describes the effects each nutrient has on blood glucose levels  Demonstrates ability to create a balanced meal plan  Demonstrates carbohydrate counting   States when to check blood glucose levels involving a total of 4 separate occurences in a [place]  Demonstrates proper blood glucose monitoring techniques  States the effect of stress and exercise on blood glucose levels  States the importance of limiting caffeine and abstaining from alcohol and smoking  Demonstrates the knowledge the glucometer provided in class may not be covered by their insurance and to call their insurance provider immediately after class to know which glucometer their insurance provider does cover as well as calling their physician the next [place] for a prescription to the glucometer their insurance does cover (if the one provided is not) as well as the lancets and strips for that meter.  Blood glucose monitor given: contour next Lot # [id number] Exp: 2017-04-27 Blood glucose reading: 128 fasting  Patient instructed to monitor glucose levels: FBS: 60 - <90 1 hour: <140 2 hour: <120  *Patient received handouts:  Nutrition Diabetes and Pregnancy  Carbohydrate Counting List  Patient will be seen for follow-up as needed.

## 2016-11-07 DIAGNOSIS — R5383 Other fatigue: Secondary | ICD-10-CM | POA: Diagnosis not present

## 2016-11-19 DIAGNOSIS — O09521 Supervision of elderly multigravida, first trimester: Secondary | ICD-10-CM | POA: Diagnosis not present

## 2016-11-26 ENCOUNTER — Other Ambulatory Visit: Payer: Self-pay | Admitting: Obstetrics and Gynecology

## 2016-12-17 DIAGNOSIS — L659 Nonscarring hair loss, unspecified: Secondary | ICD-10-CM | POA: Diagnosis not present

## 2016-12-25 DIAGNOSIS — R5383 Other fatigue: Secondary | ICD-10-CM | POA: Diagnosis not present

## 2016-12-31 ENCOUNTER — Other Ambulatory Visit: Payer: Self-pay | Admitting: Obstetrics and Gynecology

## 2016-12-31 DIAGNOSIS — O09523 Supervision of elderly multigravida, third trimester: Secondary | ICD-10-CM | POA: Diagnosis not present

## 2016-12-31 DIAGNOSIS — O26849 Uterine size-date discrepancy, unspecified trimester: Secondary | ICD-10-CM | POA: Diagnosis not present

## 2016-12-31 DIAGNOSIS — Z369 Encounter for antenatal screening, unspecified: Secondary | ICD-10-CM | POA: Diagnosis not present

## 2016-12-31 DIAGNOSIS — Z3480 Encounter for supervision of other normal pregnancy, unspecified trimester: Secondary | ICD-10-CM | POA: Diagnosis not present

## 2016-12-31 LAB — OB RESULTS CONSOLE GBS: GBS: NEGATIVE

## 2017-01-07 DIAGNOSIS — O09523 Supervision of elderly multigravida, third trimester: Secondary | ICD-10-CM | POA: Diagnosis not present

## 2017-01-10 ENCOUNTER — Telehealth (HOSPITAL_COMMUNITY): Payer: Self-pay | Admitting: *Deleted

## 2017-01-10 NOTE — Telephone Encounter (Signed)
Preadmission screen  

## 2017-01-13 ENCOUNTER — Encounter (HOSPITAL_COMMUNITY): Payer: Self-pay

## 2017-01-15 DIAGNOSIS — O09523 Supervision of elderly multigravida, third trimester: Secondary | ICD-10-CM | POA: Diagnosis not present

## 2017-01-21 DIAGNOSIS — O09523 Supervision of elderly multigravida, third trimester: Secondary | ICD-10-CM | POA: Diagnosis not present

## 2017-01-23 ENCOUNTER — Other Ambulatory Visit: Payer: Self-pay | Admitting: Obstetrics and Gynecology

## 2017-01-24 ENCOUNTER — Encounter (HOSPITAL_COMMUNITY)
Admission: RE | Admit: 2017-01-24 | Discharge: 2017-01-24 | Disposition: A | Discharge status: Home / Self Care | Payer: BLUE CROSS/BLUE SHIELD | Source: Ambulatory Visit | Attending: Obstetrics and Gynecology | Admitting: Obstetrics and Gynecology

## 2017-01-24 DIAGNOSIS — O34211 Maternal care for low transverse scar from previous cesarean delivery: Secondary | ICD-10-CM | POA: Diagnosis not present

## 2017-01-24 DIAGNOSIS — Z3A00 Weeks of gestation of pregnancy not specified: Secondary | ICD-10-CM | POA: Diagnosis not present

## 2017-01-24 DIAGNOSIS — O34219 Maternal care for unspecified type scar from previous cesarean delivery: Secondary | ICD-10-CM | POA: Diagnosis not present

## 2017-01-24 DIAGNOSIS — Z23 Encounter for immunization: Secondary | ICD-10-CM | POA: Diagnosis not present

## 2017-01-24 DIAGNOSIS — R634 Abnormal weight loss: Secondary | ICD-10-CM | POA: Diagnosis not present

## 2017-01-24 DIAGNOSIS — Z3A39 39 weeks gestation of pregnancy: Secondary | ICD-10-CM | POA: Diagnosis not present

## 2017-01-24 DIAGNOSIS — O24429 Gestational diabetes mellitus in childbirth, unspecified control: Secondary | ICD-10-CM | POA: Diagnosis not present

## 2017-01-24 HISTORY — DX: Gestational diabetes mellitus in pregnancy, unspecified control: O24.419

## 2017-01-24 HISTORY — DX: Systemic involvement of connective tissue, unspecified: M35.9

## 2017-01-24 LAB — TYPE AND SCREEN
ABO/RH(D): O POS
ANTIBODY SCREEN: NEGATIVE

## 2017-01-24 LAB — CBC
HEMATOCRIT: 39.1 % (ref 36.0–46.0)
Hemoglobin: 13.5 g/dL (ref 12.0–15.0)
MCH: 32.3 pg (ref 26.0–34.0)
MCHC: 34.5 g/dL (ref 30.0–36.0)
MCV: 93.5 fL (ref 78.0–100.0)
PLATELETS: 143 10*3/uL — AB (ref 150–400)
RBC: 4.18 MIL/uL (ref 3.87–5.11)
RDW: 13.3 % (ref 11.5–15.5)
WBC: 7.1 10*3/uL (ref 4.0–10.5)

## 2017-01-24 LAB — ABO/RH: ABO/RH(D): O POS

## 2017-01-24 NOTE — Patient Instructions (Signed)
Madeline Flores  01/24/2017   Your procedure is scheduled on:  01/27/2017  Enter through the Main Entrance of [facility] at Home up the phone at the desk and dial [phone].   Call this number if you have problems the morning of surgery: [phone]   Remember:   Do not eat food:After Midnight.  Do not drink clear liquids: After Midnight.  Take these medicines the morning of surgery with A SIP OF WATER: none   Do not wear jewelry, make-up or nail polish.  Do not wear lotions, powders, or perfumes. Do not wear deodorant.  Do not shave 48 hours prior to surgery.  Do not bring valuables to the hospital.  [facility] is not   responsible for any belongings or valuables brought to the hospital.  Contacts, dentures or bridgework may not be worn into surgery.  Leave suitcase in the car. After surgery it may be brought to your room.  For patients admitted to the hospital, checkout time is 11:00 AM the [place] of              discharge.   Patients discharged the [place] of surgery will not be allowed to drive             home.  Name and phone number of your driver: na  Special Instructions:   N/A   Please read over the following fact sheets that you were given:   Surgical Site Infection Prevention

## 2017-01-25 LAB — RPR: RPR Ser Ql: NONREACTIVE

## 2017-01-27 ENCOUNTER — Encounter (HOSPITAL_COMMUNITY)
Admission: RE | Disposition: A | Discharge status: Home / Self Care | Payer: Self-pay | Source: Ambulatory Visit | Attending: Obstetrics and Gynecology

## 2017-01-27 ENCOUNTER — Inpatient Hospital Stay (HOSPITAL_COMMUNITY): Payer: BLUE CROSS/BLUE SHIELD | Admitting: Certified Registered Nurse Anesthetist

## 2017-01-27 ENCOUNTER — Encounter (HOSPITAL_COMMUNITY): Payer: Self-pay | Admitting: *Deleted

## 2017-01-27 ENCOUNTER — Inpatient Hospital Stay (HOSPITAL_COMMUNITY)
Admission: RE | Admit: 2017-01-27 | Discharge: 2017-01-29 | DRG: 766 | Disposition: A | Discharge status: Home / Self Care | Payer: BLUE CROSS/BLUE SHIELD | Source: Ambulatory Visit | Attending: Obstetrics and Gynecology | Admitting: Obstetrics and Gynecology

## 2017-01-27 DIAGNOSIS — Z3490 Encounter for supervision of normal pregnancy, unspecified, unspecified trimester: Secondary | ICD-10-CM

## 2017-01-27 DIAGNOSIS — O34211 Maternal care for low transverse scar from previous cesarean delivery: Secondary | ICD-10-CM | POA: Diagnosis present

## 2017-01-27 DIAGNOSIS — Z3A39 39 weeks gestation of pregnancy: Secondary | ICD-10-CM | POA: Diagnosis not present

## 2017-01-27 DIAGNOSIS — O09529 Supervision of elderly multigravida, unspecified trimester: Secondary | ICD-10-CM

## 2017-01-27 DIAGNOSIS — O24429 Gestational diabetes mellitus in childbirth, unspecified control: Secondary | ICD-10-CM | POA: Diagnosis present

## 2017-01-27 LAB — GLUCOSE, CAPILLARY
GLUCOSE-CAPILLARY: 54 mg/dL — AB (ref 65–99)
GLUCOSE-CAPILLARY: 144 mg/dL — AB (ref 65–99)

## 2017-01-27 SURGERY — Surgical Case
Anesthesia: Spinal | Site: Abdomen | Wound class: Clean Contaminated

## 2017-01-27 MED ORDER — MEPERIDINE HCL 25 MG/ML IJ SOLN: 6.2500 mg | INTRAMUSCULAR | Status: DC | PRN

## 2017-01-27 MED ORDER — NALBUPHINE HCL 10 MG/ML IJ SOLN
5.0000 mg | INTRAMUSCULAR | Status: DC | PRN
  Filled 2017-01-27: qty 0.5

## 2017-01-27 MED ORDER — ONDANSETRON HCL 4 MG/2ML IJ SOLN: 4.0000 mg | Freq: Three times a day (TID) | INTRAMUSCULAR | Status: DC | PRN

## 2017-01-27 MED ORDER — SCOPOLAMINE 1 MG/3DAYS TD PT72: 1.0000 | MEDICATED_PATCH | Freq: Once | TRANSDERMAL | Status: DC

## 2017-01-27 MED ORDER — LACTATED RINGERS IV SOLN
INTRAVENOUS | Status: DC
  Administered 2017-01-27: 20:00:00 via INTRAVENOUS

## 2017-01-27 MED ORDER — ZOLPIDEM TARTRATE 5 MG PO TABS: 5.0000 mg | ORAL_TABLET | Freq: Every evening | ORAL | Status: DC | PRN

## 2017-01-27 MED ORDER — MORPHINE SULFATE (PF) 0.5 MG/ML IJ SOLN
INTRAMUSCULAR | Status: DC | PRN
  Administered 2017-01-27: .2 mg via INTRATHECAL

## 2017-01-27 MED ORDER — NALBUPHINE SYRINGE 5 MG/0.5 ML: 5.0000 mg | INJECTION | INTRAMUSCULAR | Status: DC | PRN

## 2017-01-27 MED ORDER — BUPIVACAINE IN DEXTROSE 0.75-8.25 % IT SOLN
INTRATHECAL | Status: AC
  Filled 2017-01-27: qty 2

## 2017-01-27 MED ORDER — COCONUT OIL OIL
1.0000 "application " | TOPICAL_OIL | Status: DC | PRN
  Administered 2017-01-28: 1 via TOPICAL
  Filled 2017-01-27: qty 120

## 2017-01-27 MED ORDER — PHENYLEPHRINE 8 MG IN D5W 100 ML (0.08MG/ML) PREMIX OPTIME
INJECTION | INTRAVENOUS | Status: AC
  Filled 2017-01-27: qty 100

## 2017-01-27 MED ORDER — OXYTOCIN 10 UNIT/ML IJ SOLN
INTRAMUSCULAR | Status: AC
  Filled 2017-01-27: qty 4

## 2017-01-27 MED ORDER — DIPHENHYDRAMINE HCL 25 MG PO CAPS: 25.0000 mg | ORAL_CAPSULE | ORAL | Status: DC | PRN

## 2017-01-27 MED ORDER — NALBUPHINE SYRINGE 5 MG/0.5 ML: 5.0000 mg | INJECTION | Freq: Once | INTRAMUSCULAR | Status: DC | PRN

## 2017-01-27 MED ORDER — PHENYLEPHRINE 8 MG IN D5W 100 ML (0.08MG/ML) PREMIX OPTIME
INJECTION | INTRAVENOUS | Status: DC | PRN
  Administered 2017-01-27: 60 ug/min via INTRAVENOUS

## 2017-01-27 MED ORDER — SODIUM CHLORIDE 0.9% FLUSH: 3.0000 mL | INTRAVENOUS | Status: DC | PRN

## 2017-01-27 MED ORDER — KETOROLAC TROMETHAMINE 30 MG/ML IJ SOLN: 30.0000 mg | Freq: Four times a day (QID) | INTRAMUSCULAR | Status: DC | PRN

## 2017-01-27 MED ORDER — FENTANYL CITRATE (PF) 100 MCG/2ML IJ SOLN
INTRAMUSCULAR | Status: DC | PRN
  Administered 2017-01-27: 25 ug via INTRATHECAL

## 2017-01-27 MED ORDER — NALOXONE HCL 0.4 MG/ML IJ SOLN: 0.4000 mg | INTRAMUSCULAR | Status: DC | PRN

## 2017-01-27 MED ORDER — PRENATAL MULTIVITAMIN CH
1.0000 | ORAL_TABLET | Freq: Every day | ORAL | Status: DC
  Filled 2017-01-27: qty 1

## 2017-01-27 MED ORDER — LACTATED RINGERS IV SOLN
INTRAVENOUS | Status: DC | PRN
  Administered 2017-01-27: 12:00:00 via INTRAVENOUS

## 2017-01-27 MED ORDER — DEXTROSE 5 % IV SOLN: 1.0000 ug/kg/h | INTRAVENOUS | Status: DC | PRN

## 2017-01-27 MED ORDER — KETOROLAC TROMETHAMINE 30 MG/ML IJ SOLN: 30.0000 mg | Freq: Four times a day (QID) | INTRAMUSCULAR | Status: AC | PRN

## 2017-01-27 MED ORDER — DIBUCAINE 1 % RE OINT: 1.0000 "application " | TOPICAL_OINTMENT | RECTAL | Status: DC | PRN

## 2017-01-27 MED ORDER — SIMETHICONE 80 MG PO CHEW
80.0000 mg | CHEWABLE_TABLET | ORAL | Status: DC
  Administered 2017-01-29: 80 mg via ORAL
  Filled 2017-01-27 (×2): qty 1

## 2017-01-27 MED ORDER — IBUPROFEN 600 MG PO TABS
600.0000 mg | ORAL_TABLET | Freq: Four times a day (QID) | ORAL | Status: DC
  Administered 2017-01-28 – 2017-01-29 (×4): 600 mg via ORAL
  Filled 2017-01-27 (×6): qty 1

## 2017-01-27 MED ORDER — DEXTROSE IN LACTATED RINGERS 5 % IV SOLN
INTRAVENOUS | Status: DC
  Administered 2017-01-27: 11:00:00 via INTRAVENOUS

## 2017-01-27 MED ORDER — LACTATED RINGERS IV SOLN
INTRAVENOUS | Status: DC
  Administered 2017-01-27 (×2): via INTRAVENOUS

## 2017-01-27 MED ORDER — SENNOSIDES-DOCUSATE SODIUM 8.6-50 MG PO TABS
2.0000 | ORAL_TABLET | ORAL | Status: DC
  Administered 2017-01-29: 2 via ORAL
  Filled 2017-01-27 (×3): qty 2

## 2017-01-27 MED ORDER — WITCH HAZEL-GLYCERIN EX PADS: 1.0000 "application " | MEDICATED_PAD | CUTANEOUS | Status: DC | PRN

## 2017-01-27 MED ORDER — DIPHENHYDRAMINE HCL 50 MG/ML IJ SOLN: 12.5000 mg | INTRAMUSCULAR | Status: DC | PRN

## 2017-01-27 MED ORDER — ACETAMINOPHEN 325 MG PO TABS: 650.0000 mg | ORAL_TABLET | ORAL | Status: DC | PRN

## 2017-01-27 MED ORDER — FENTANYL CITRATE (PF) 100 MCG/2ML IJ SOLN
INTRAMUSCULAR | Status: AC
  Filled 2017-01-27: qty 2

## 2017-01-27 MED ORDER — OXYCODONE-ACETAMINOPHEN 5-325 MG PO TABS: 1.0000 | ORAL_TABLET | ORAL | Status: DC | PRN

## 2017-01-27 MED ORDER — CEFAZOLIN SODIUM-DEXTROSE 2-4 GM/100ML-% IV SOLN
2.0000 g | INTRAVENOUS | Status: AC
  Administered 2017-01-27: 2 g via INTRAVENOUS
  Filled 2017-01-27: qty 100

## 2017-01-27 MED ORDER — SCOPOLAMINE 1 MG/3DAYS TD PT72
MEDICATED_PATCH | TRANSDERMAL | Status: AC
  Filled 2017-01-27: qty 1

## 2017-01-27 MED ORDER — OXYCODONE-ACETAMINOPHEN 5-325 MG PO TABS: 2.0000 | ORAL_TABLET | ORAL | Status: DC | PRN

## 2017-01-27 MED ORDER — OXYTOCIN 40 UNITS IN LACTATED RINGERS INFUSION - SIMPLE MED
2.5000 [IU]/h | INTRAVENOUS | Status: AC
  Administered 2017-01-27: 2.5 [IU]/h via INTRAVENOUS
  Filled 2017-01-27: qty 1000

## 2017-01-27 MED ORDER — BUPIVACAINE HCL (PF) 0.75 % IJ SOLN
INTRAMUSCULAR | Status: DC | PRN
  Administered 2017-01-27: 12 mg via INTRATHECAL

## 2017-01-27 MED ORDER — TETANUS-DIPHTH-ACELL PERTUSSIS 5-2.5-18.5 LF-MCG/0.5 IM SUSP: 0.5000 mL | Freq: Once | INTRAMUSCULAR | Status: DC

## 2017-01-27 MED ORDER — MORPHINE SULFATE (PF) 0.5 MG/ML IJ SOLN
INTRAMUSCULAR | Status: AC
  Filled 2017-01-27: qty 10

## 2017-01-27 MED ORDER — SODIUM CHLORIDE 0.9 % IR SOLN
Status: DC | PRN
  Administered 2017-01-27: 1

## 2017-01-27 MED ORDER — MEASLES, MUMPS & RUBELLA VAC ~~LOC~~ INJ: 0.5000 mL | INJECTION | Freq: Once | SUBCUTANEOUS | Status: DC

## 2017-01-27 MED ORDER — ONDANSETRON HCL 4 MG/2ML IJ SOLN
INTRAMUSCULAR | Status: DC | PRN
  Administered 2017-01-27: 4 mg via INTRAVENOUS

## 2017-01-27 MED ORDER — NALOXONE HCL 2 MG/2ML IJ SOSY: 1.0000 ug/kg/h | PREFILLED_SYRINGE | INTRAVENOUS | Status: DC | PRN

## 2017-01-27 MED ORDER — ONDANSETRON HCL 4 MG/2ML IJ SOLN
INTRAMUSCULAR | Status: AC
  Filled 2017-01-27: qty 2

## 2017-01-27 MED ORDER — OXYTOCIN 10 UNIT/ML IJ SOLN
INTRAVENOUS | Status: DC | PRN
  Administered 2017-01-27: 40 [IU] via INTRAVENOUS

## 2017-01-27 SURGICAL SUPPLY — 29 items
APL SKNCLS STERI-STRIP NONHPOA (GAUZE/BANDAGES/DRESSINGS) ×1
BENZOIN TINCTURE PRP APPL 2/3 (GAUZE/BANDAGES/DRESSINGS) ×1 IMPLANT
CHLORAPREP W/TINT 26ML (MISCELLANEOUS) ×2 IMPLANT
CLAMP CORD UMBIL (MISCELLANEOUS) IMPLANT
CLOSURE STERI STRIP 1/2 X4 (GAUZE/BANDAGES/DRESSINGS) ×1 IMPLANT
CLOTH BEACON ORANGE TIMEOUT ST (SAFETY) ×2 IMPLANT
CONTAINER PREFILL 10% NBF 15ML (MISCELLANEOUS) IMPLANT
DRSG OPSITE POSTOP 4X10 (GAUZE/BANDAGES/DRESSINGS) ×2 IMPLANT
ELECT REM PT RETURN 9FT ADLT (ELECTROSURGICAL) ×2
ELECTRODE REM PT RTRN 9FT ADLT (ELECTROSURGICAL) ×1 IMPLANT
EXTRACTOR VACUUM M CUP 4 TUBE (SUCTIONS) IMPLANT
GLOVE BIOGEL M 7.0 STRL (GLOVE) ×1 IMPLANT
GLOVE BIOGEL PI IND STRL 7.0 (GLOVE) ×1 IMPLANT
GLOVE BIOGEL PI INDICATOR 7.0 (GLOVE) ×1
GLOVE ECLIPSE 7.0 STRL STRAW (GLOVE) ×4 IMPLANT
GOWN STRL REUS W/TWL LRG LVL3 (GOWN DISPOSABLE) ×4 IMPLANT
KIT ABG SYR 3ML LUER SLIP (SYRINGE) IMPLANT
NDL HYPO 25X5/8 SAFETYGLIDE (NEEDLE) IMPLANT
NEEDLE HYPO 25X5/8 SAFETYGLIDE (NEEDLE) IMPLANT
NS IRRIG 1000ML POUR BTL (IV SOLUTION) ×2 IMPLANT
PACK C SECTION WH (CUSTOM PROCEDURE TRAY) ×2 IMPLANT
PAD OB MATERNITY 4.3X12.25 (PERSONAL CARE ITEMS) ×2 IMPLANT
RTRCTR C-SECT PINK 25CM LRG (MISCELLANEOUS) ×1 IMPLANT
SUT MNCRL 0 VIOLET CTX 36 (SUTURE) ×3 IMPLANT
SUT MON AB 2-0 CT1 27 (SUTURE) ×4 IMPLANT
SUT MONOCRYL 0 CTX 36 (SUTURE) ×3
SUT PLAIN 0 NONE (SUTURE) IMPLANT
TOWEL OR 17X24 6PK STRL BLUE (TOWEL DISPOSABLE) ×2 IMPLANT
TRAY FOLEY BAG SILVER LF 14FR (SET/KITS/TRAYS/PACK) IMPLANT

## 2017-01-27 NOTE — Anesthesia Preprocedure Evaluation (Addendum)
Anesthesia Evaluation  Patient identified by MRN, date of birth, ID band Patient awake    Reviewed: Allergy & Precautions, H&P , NPO status , Patient's Chart, lab work & pertinent test results, reviewed documented beta blocker date and time   Airway Mallampati: I  TM Distance: >3 FB Neck ROM: full    Dental no notable dental hx.    Pulmonary neg pulmonary ROS,    Pulmonary exam normal breath sounds clear to auscultation       Cardiovascular negative cardio ROS Normal cardiovascular exam Rhythm:regular Rate:Normal     Neuro/Psych negative neurological ROS  negative psych ROS   GI/Hepatic negative GI ROS, Neg liver ROS, GERD  ,  Endo/Other  negative endocrine ROSdiabetes, Well Controlled, Type 2  Renal/GU negative Renal ROS  negative genitourinary   Musculoskeletal   Abdominal   Peds  Hematology negative hematology ROS (+)   Anesthesia Other Findings   Reproductive/Obstetrics (+) Pregnancy                           Anesthesia Physical Anesthesia Plan  ASA: II  Anesthesia Plan: Spinal   Post-op Pain Management:    Induction:   PONV Risk Score and Plan: 2 and Ondansetron and Dexamethasone  Airway Management Planned:   Additional Equipment:   Intra-op Plan:   Post-operative Plan:   Informed Consent: I have reviewed the patients History and Physical, chart, labs and discussed the procedure including the risks, benefits and alternatives for the proposed anesthesia with the patient or authorized representative who has indicated his/her understanding and acceptance.     Plan Discussed with: CRNA  Anesthesia Plan Comments:         Anesthesia Quick Evaluation

## 2017-01-27 NOTE — Anesthesia Postprocedure Evaluation (Signed)
Anesthesia Post Note  Patient: Madeline Flores  Procedure(s) Performed: Procedure(s) (LRB): REPEAT CESAREAN SECTION (N/A)     Anesthesia Post Evaluation  Last Vitals:  Vitals:   01/27/17 1230 01/27/17 1245  BP: 128/64 102/72  Pulse: 99 80  Resp: 16 15  Temp:      Last Pain:  Vitals:   01/27/17 1218  TempSrc: Oral   Pain Goal: Patients Stated Pain Goal: 5 (01/27/17 0940)               Kymberly Blomberg

## 2017-01-27 NOTE — H&P (Signed)
Madeline Flores is an 41 y.o. G5P1001 14w4dfemale who presents for a repeat C/S. PNC was complicated by AMA and GDM. She had a nl NIPT. Her sugars have been very well controlled.She denied an attempt at a VBAC Chief Complaint: HPI:  Past Medical History:  Diagnosis Date  . Celiac sprue   . [place]ctive tissue disease ([place]   . Diabetes mellitus without complication ([place]   . Gestational diabetes   . Heart murmur   . Osteopenia     Past Surgical History:  Procedure Laterality Date  . CESAREAN SECTION    . KNEE ARTHROSCOPY    . WISDOM TOOTH EXTRACTION      Family History  Problem Relation Age of Onset  . Heart disease Mother   . Hyperlipidemia Mother   . Hypertension Mother   . Asthma Brother   . Hyperlipidemia Father   . Hypertension Father    Social History:  reports that she has never smoked. She has never used smokeless tobacco. She reports that she does not drink alcohol or use drugs.  Allergies:  Allergies  Allergen Reactions  . Clarithromycin     REACTION: UPSET STOMACH  . Gluten Meal Nausea And Vomiting  . Other     Epi intolerance---racing heart  . Wheat Bran Nausea And Vomiting    Medications Prior to Admission  Medication Sig Dispense Refill  . hydrocortisone cream 0.5 % Apply 1 application topically daily as needed for itching.  30 g 0  . loperamide (IMODIUM) 2 MG capsule Take 2 mg by mouth as needed for diarrhea or loose stools.    . Prenat w/o A-FeCbGl-[id number]-FA-DHA (CITRANATAL ASSURE) 35-1 & 300 MG tablet TK 1 PACKET QD FOR 30 DAYS  4  . Dermatological Products, Misc. (ELETONE) CREA As directed (Patient not taking: Reported on 08/30/2016) 100 g 5       Blood pressure 127/77, pulse 68, temperature 98.4 F (36.9 C), temperature source Oral, resp. rate 16, height 5' 4"  (1.626 m), weight 133 lb (60.3 kg), last menstrual period 04/25/2016. General appearance: alert and cooperative Lungs: clear to auscultation bilaterally Heart: regular rate and rhythm, S1, S2  normal, no murmur, click, rub or gallop Abdomen: gravid, non tender   Lab Results  Component Value Date   WBC 7.1 01/24/2017   HGB 13.5 01/24/2017   HCT 39.1 01/24/2017   MCV 93.5 01/24/2017   PLT 143 (L) 01/24/2017   No results found for: PREGTESTUR, PREGSERUM, HCG, HCGQUANT    Patient Active Problem List   Diagnosis Date Noted  . Advanced maternal age in multigravida 07/04/2016  . Paronychia 11/09/2015  . OTHER SEBORRHEIC DERMATITIS 05/01/2010  . Palpitations 05/01/2010  . CERVICAL LYMPHADENOPATHY 12/01/2008  . PERSONAL HISTORY CONTACT WITH & EXPOSURE TO LEAD 12/01/2008  . GERD 02/04/2008  . SINUSITIS- ACUTE-NOS 08/05/2007  . ANXIETY STATE NOS 03/16/2007  . NAUSEA, CHRONIC 03/16/2007  . NEUTROPENIA NOS 11/18/2006  . RAYNAUD'S SYNDROME 11/18/2006  . CELIAC SPRUE 11/18/2006  . OSTEOPENIA 11/18/2006  . ANA POSITIVE 11/18/2006   IMP/ IUP at term with a h.o. C/S, declines VBAC         AMA         GDM Plan/ Admit          Proceed with surgery  Trinda Harlacher E 01/27/2017, 11:14 AM

## 2017-01-27 NOTE — Progress Notes (Signed)
MOB was referred for history of depression/anxiety. * Referral screened out by Clinical Social Worker because none of the following criteria appear to apply: ~ History of anxiety/depression during this pregnancy, or of post-partum depression. ~ Diagnosis of anxiety and/or depression within last 3 years OR * MOB's symptoms currently being treated with medication and/or therapy. Please contact the Clinical Social Worker if needs arise, or if MOB requests.  Chart notes dx in 2008.  No mention of mental health concerns noted in [facility].

## 2017-01-27 NOTE — Lactation Note (Signed)
This note was copied from a baby's chart. Lactation Consultation Note  Patient Name: Madeline Flores Today's Date: 01/27/2017 Reason for consult: Initial assessment  Assisted P2 Mom of 2 hr old term baby delivered by C-Section, in PACU.  Baby on the breast in cross cradle and laid back position.  Mom had a poor experience with early breastfeeding with her first child (42 yrs old), but ended up breastfeeding for 3 yrs.   Demonstrated how to use alternate breast compression.  Baby noted to have a vigorous suck pattern.  No discomfort or pinching per Mom.  After 3 rounds of 10 minute latches, baby rested on Mom's breast.  Mom seems relieved as she was very anxious about this baby.   Demonstrated hand expression and breast massage.  Encouraged Mom to do this before and after breastfeeding to stimulate milk supply, and offer by spoon. Encouraged continued STS, and cue based feedings.  Mom to call for assistance as needed. Mom given Brochure, placed in chart.    Maternal Data Has patient been taught Hand Expression?: Yes Does the patient have breastfeeding experience prior to this delivery?: Yes  Feeding Feeding Type: Breast Fed Length of feed: 10 min  LATCH Score/Interventions Latch: Grasps breast easily, tongue down, lips flanged, rhythmical sucking. Intervention(s): Breast massage;Breast compression  Audible Swallowing: Spontaneous and intermittent Intervention(s): Alternate breast massage;Hand expression;Skin to skin  Type of Nipple: Everted at rest and after stimulation  Comfort (Breast/Nipple): Soft / non-tender     Hold (Positioning): Assistance needed to correctly position infant at breast and maintain latch. Intervention(s): Breastfeeding basics reviewed;Support Pillows;Position options;Skin to skin  LATCH Score: 9  Consult Status Consult Status: Follow-up Date: 01/28/17 Follow-up type: In-patient    Madeline Flores 01/27/2017, 1:50 PM

## 2017-01-27 NOTE — Transfer of Care (Signed)
Immediate Anesthesia Transfer of Care Note  Patient: Madeline Flores  Procedure(s) Performed: Procedure(s): REPEAT CESAREAN SECTION (N/A)  Patient Location: PACU  Anesthesia Type:Spinal  Level of Consciousness: awake, alert  and oriented  Airway & Oxygen Therapy: Patient Spontanous Breathing  Post-op Assessment: Report given to RN and Post -op Vital signs reviewed and stable  Post vital signs: Reviewed and stable  Last Vitals:  Vitals:   01/27/17 0940  BP: 127/77  Pulse: 68  Resp: 16  Temp: 36.9 C    Last Pain:  Vitals:   01/27/17 0940  TempSrc: Oral      Patients Stated Pain Goal: 5 (28/20/81 3887)  Complications: No apparent anesthesia complications

## 2017-01-27 NOTE — Lactation Note (Addendum)
This note was copied from a baby's chart. Lactation Consultation Note  Patient Name: Madeline Flores Today's Date: 01/27/2017 Reason for consult: Follow-up assessment  I received a call that Mom needed a latch assist, but when I entered room, infant had already latched successfully. Swallows were evident to the naked eye & Mom was comfortable w/latch. Parents pleased that this infant, "[name]," is latching better than their 1st child.  Mom w/hx of Raynaud's.  Matthias Hughs [facility] 01/27/2017, 7:18 PM

## 2017-01-27 NOTE — Anesthesia Procedure Notes (Signed)
Spinal  Patient location during procedure: OR Start time: 01/27/2017 11:30 AM Preanesthetic Checklist Completed: patient identified, site marked, surgical consent, pre-op evaluation, timeout performed, IV checked, risks and benefits discussed and monitors and equipment checked Spinal Block Patient position: sitting Prep: DuraPrep Patient monitoring: heart rate, cardiac monitor, continuous pulse ox and blood pressure Approach: midline Location: L3-4 Injection technique: single-shot Needle Needle type: Sprotte  Needle gauge: 24 G Needle length: 9 cm Assessment Sensory level: T4

## 2017-01-28 LAB — CBC
HCT: 37.7 % (ref 36.0–46.0)
HEMOGLOBIN: 13.3 g/dL (ref 12.0–15.0)
MCH: 32.8 pg (ref 26.0–34.0)
MCHC: 35.3 g/dL (ref 30.0–36.0)
MCV: 93.1 fL (ref 78.0–100.0)
Platelets: 117 10*3/uL — ABNORMAL LOW (ref 150–400)
RBC: 4.05 MIL/uL (ref 3.87–5.11)
RDW: 13.2 % (ref 11.5–15.5)
WBC: 11 10*3/uL — ABNORMAL HIGH (ref 4.0–10.5)

## 2017-01-28 NOTE — Lactation Note (Signed)
This note was copied from a baby's chart. Lactation Consultation Note  Patient Name: Madeline Flores Today's Date: 01/28/2017 Reason for consult: Follow-up assessment;Breast/nipple pain;Difficult latch  MBU RN Betsy requested comfort gels for mom and reports she may need nipple shield for feedings.  LC follow up visit at 29 hours of age.  Last feeding >62mn ago for 10 minutes.   B26older sister holding awake and alert baby showing feeding cues. Mom reports nipple pain with bruising noted. Mom has semi flat nipple with possible short shaft. Bruising noted with compression stripes, nipple appears intact, with pain.  Mom was given comfort gels by Rn, not in use yet. Mom is not eager to use hand pump because she doesn't like it.  Mom doesn't want to do hand expression either.  LC noted baby is showing feeding cues, mom doesn't want to work on feeding, but FOB encouraged mom to try and explained to herbaby is hungry.  LC discussed spoon feedings with mom and she doesn't want to work on her breasts to do that (because they are sore).  Mom agreed to feed baby. LC offered Nipple shields and explained risks and benefits and mom wants to try without. [place] with proper cross cradle positioning to allow baby closer to mom for depth with latch.   Baby opens mouth with wide gape and sucks a few times and stops.  When baby is removed baby she is very active with hands to mouth, eager to eat.   Mom is not receptive to help at this time and will call for assist with nipple shield as needed.  Mom is avoiding eye contact and reports having difficulty with older child and this will get better as it did with her.   LC encouraged use of hand pump to help evert nipples, hand expression to start milk flow and comfort gels for comfort.  LC to report to RN, BGwinda Passe    Maternal Data Has patient been taught Hand Expression?: Yes  Feeding Feeding Type: Breast Fed Length of feed:  (few sucks )  LATCH  Score/Interventions Latch: Repeated attempts needed to sustain latch, nipple held in mouth throughout feeding, stimulation needed to elicit sucking reflex. Intervention(s): Skin to skin Intervention(s): Breast massage;Assist with latch;Adjust position  Audible Swallowing: None  Type of Nipple: Flat (has hand pump not using, to much pain to hand express)  Comfort (Breast/Nipple): Filling, red/small blisters or bruises, mild/mod discomfort  Problem noted: Mild/Moderate discomfort (bruising) Interventions (Mild/moderate discomfort): Comfort gels;Pre-pump if needed;Hand expression;Hand massage  Hold (Positioning): Assistance needed to correctly position infant at breast and maintain latch. Intervention(s): Breastfeeding basics reviewed;Support Pillows;Position options;Skin to skin  LATCH Score: 4  Lactation Tools Discussed/Used Initiated by:: already in room, mom no using   Consult Status Consult Status: Follow-up Date: 01/29/17 Follow-up type: In-patient    Shoptaw, JJustine Null7/09/2016, 5:21 PM

## 2017-01-28 NOTE — Progress Notes (Signed)
Subjective: Postpartum [place] 1: Cesarean Delivery Patient reports pain controlled, no nausea or vomiting, ambulating without difficulty  Objective: Vital signs in last 24 hours: Temp:  [97.6 F (36.4 C)-98.3 F (36.8 C)] 98.3 F (36.8 C) (07/03 1300) Pulse Rate:  [59-72] 67 (07/03 1300) Resp:  [18-19] 18 (07/03 1300) BP: (99-111)/(62-72) 106/66 (07/03 1300) SpO2:  [98 %] 98 % (07/03 1300)  Physical Exam:  General: alert, cooperative and appears stated age Lochia: appropriate Uterine Fundus: firm Incision: healing well DVT Evaluation: No evidence of DVT seen on physical exam.   Recent Labs  01/28/17 0517  HGB 13.3  HCT 37.7    Assessment/Plan: Status post Cesarean section. Doing well postoperatively.  Continue current care. Desires discharge tomorrow  Elza Varricchio H. 01/28/2017, 4:28 PM

## 2017-01-28 NOTE — Op Note (Signed)
NAME:  Madeline Flores, Madeline Flores                 ACCOUNT NO.:  [id number]  MEDICAL RECORD NO.:  LOCATION:                                 FACILITY:  PHYSICIAN:  Freda Munro, M.D.         DATE OF BIRTH:  DATE OF PROCEDURE:  01/27/2017 DATE OF DISCHARGE:                              OPERATIVE REPORT   PREOPERATIVE DIAGNOSES: 1. Intrauterine pregnancy at term. 2. Advanced maternal age. 3. Gestational diabetes mellitus. 4. History of prior cesarean section. 5. The patient declines attempted vaginal birth.  POSTOPERATIVE DIAGNOSES: 1. Intrauterine pregnancy at term. 2. Advanced maternal age. 3. Gestational diabetes mellitus. 4. History of prior cesarean section. 5. The patient declines attempted vaginal birth.  PROCEDURE:  Repeat low transverse cesarean section.  SURGEON:  Freda Munro, M.D.  ANESTHESIA:  Spinal.  ANTIBIOTICS:  Ancef 2 g.  DRAINS:  Foley bedside drainage.  ESTIMATED BLOOD LOSS:  900 mL.  SPECIMENS:  None.  ASSISTANT:  Ross.  DESCRIPTION OF PROCEDURE:  The patient was taken to the operating room where spinal anesthetic was administered without difficulty.  She was then placed in dorsal supine position with left lateral tilt.  She was prepped and draped in the usual fashion for this procedure.  Foley catheter was placed.  A Pfannenstiel incision was made through the previous scar.  On entering the abdominal cavity, the bladder flap was taken down with sharp dissection.  A low transverse uterine incision was made in the midline using the Metzenbaum scissors.  Amniotic fluid was noted to be clear.  The infant was delivered with the vacuum extractor. Once the head was delivered, the oropharynx and nostrils were bulb suctioned.  The remaining infant was then delivered.  The cord was elevated, doubly clamped and cut, and the infant handed to the awaiting NICU team.  The cord blood was then obtained.  The placenta was manually removed.  The uterus was exteriorized  and the uterine cavity was cleaned with a wet lap.  The uterus was then inspected.  Fallopian tubes and ovaries were within normal limits.  The cavity was normal.  A single layer of 0 Monocryl suture was used in a running locking fashion to close the uterine incision.  Bladder flap was not closed.  The uterus was placed back in the abdominal cavity.  Hemostasis was checked and felt to be adequate.  The parietal perineum and rectus muscles were reapproximated in the midline using 2-0 Monocryl in a running fashion. The fascia was closed using 0 Monocryl suture in a running fashion. Subcuticular tissue was made hemostatic with the Bovie.  The skin was then closed in a subcuticular fashion with 4-0 Rapide suture.  Steri- Strips were applied.  The patient was taken to the recovery room in stable condition.  Instrument and lap counts were correct x3.          ______________________________ Freda Munro, M.D.     MA/MEDQ  D:  01/27/2017  T:  01/28/2017  Job:  [id number]

## 2017-01-29 MED ORDER — OXYCODONE-ACETAMINOPHEN 5-325 MG PO TABS: 1.0000 | ORAL_TABLET | ORAL | 0 refills | Status: DC | PRN

## 2017-01-29 NOTE — Progress Notes (Signed)
  Patient is eating, ambulating, voiding.  Pain control is good.  Vitals:   01/28/17 0800 01/28/17 1300 01/28/17 1702 01/29/17 0524  BP: 104/63 106/66 98/64 100/66  Pulse: 63 67 71 65  Resp: 18 18 17 18   Temp: 98.3 F (36.8 C) 98.3 F (36.8 C) 98.1 F (36.7 C) 98.4 F (36.9 C)  TempSrc: Axillary Oral Oral Oral  SpO2: 98% 98%    Weight:      Height:        lungs:   clear to auscultation cor:    RRR Abdomen:  soft, appropriate tenderness, incisions intact and without erythema or exudate ex:    no cords   Lab Results  Component Value Date   WBC 11.0 (H) 01/28/2017   HGB 13.3 01/28/2017   HCT 37.7 01/28/2017   MCV 93.1 01/28/2017   PLT 117 (L) 01/28/2017    --/--/O POS, O POS (06/29 1517)/RI  A/P    Post operative [place] 2.  Routine post op and postpartum care.  Expect d/c routine.  Percocet for pain control.

## 2017-01-29 NOTE — Lactation Note (Signed)
This note was copied from a baby's chart. Lactation Consultation Note  Patient Name: Madeline Flores Today's Date: 01/29/2017 Reason for consult: Follow-up assessment Infant is 70 hours old & seen by [facility] for follow-up assessment. Mom reports baby had just finished feeding when LC entered. Mom stated she is very sore but thinks it is due to the first few days and thinks it is improving since she has more milk now and baby is better at latching. Mom showed me her nipples and both have bruises on the areolas as well as compression stripes. Mom reports it is worse on her left breast and wondered whether a nipple shield would help. Discussed how they can help while she heals & that it would need to be fitted with a feeding if they want to try it. Mom stated she would like to so fitted mom with a size 105m nipple shield on left breast- baby opened her mouth but would not latch or suckle. Mom then tried without the nipple shield and after several attempts, baby did latch and suckle. Mom's breasts are starting to fill and suggested mom try hand expression to soften around the nipple but mom stated she wasn't able to remove any milk when she did hand expression previously. Offered to demonstrate for mom and a drop was seen but mom reported pain so LC stopped. Once baby latched, mom reported that she could feel some soreness but thinks it is due to old wounds. Baby's lips were flanged and a few swallows were noted. Reviewed instruction sheet on Nipple shield- discussed use & cleaning. Discussed how it is meant as a temporary tool and that if she uses it frequently once home, that she should pump afterwards and make an outpatient appointment. Discussed coconut oil or comfort gels as well as air drying her nipples for soreness. Mom encouraged to feed baby 8-12 times/24 hours and with feeding cues. Encouraged mom to offer both breasts at every feeding. Mom reports no questions at this time. Encouraged mom to call o/p  number once home if she has any later.   Maternal Data    Feeding Feeding Type: Breast Fed Length of feed: 15 min  LATCH Score/Interventions                      Lactation Tools Discussed/Used     Consult Status Consult Status: Complete    LYvonna Alanis7/10/2016, 11:19 AM

## 2017-01-29 NOTE — Discharge Summary (Signed)
Obstetric Discharge Summary Reason for Admission: cesarean section Prenatal Procedures: NST Intrapartum Procedures: cesarean: low cervical, transverse Postpartum Procedures: none Complications-Operative and Postpartum: none Hemoglobin  Date Value Ref Range Status  01/28/2017 13.3 12.0 - 15.0 g/dL Final   HCT  Date Value Ref Range Status  01/28/2017 37.7 36.0 - 46.0 % Final     Discharge Diagnoses: Term Pregnancy-delivered  Discharge Information: Date: 01/29/2017 Activity: pelvic rest Diet: routine Medications: Percocet Condition: stable Instructions: refer to practice specific booklet Discharge to: home   Newborn Data: Live born female  Birth Weight: 7 lb 1.4 oz (3215 g) APGAR: 8, 9  Home with mother.  Lyndon Chapel A 01/29/2017, 9:07 PM

## 2017-02-24 DIAGNOSIS — Z6821 Body mass index (BMI) 21.0-21.9, adult: Secondary | ICD-10-CM | POA: Diagnosis not present

## 2017-02-24 DIAGNOSIS — Z124 Encounter for screening for malignant neoplasm of cervix: Secondary | ICD-10-CM | POA: Diagnosis not present

## 2017-05-14 DIAGNOSIS — F4322 Adjustment disorder with anxiety: Secondary | ICD-10-CM | POA: Diagnosis not present

## 2017-07-10 DIAGNOSIS — Z6820 Body mass index (BMI) 20.0-20.9, adult: Secondary | ICD-10-CM | POA: Diagnosis not present

## 2017-07-10 DIAGNOSIS — O9123 Nonpurulent mastitis associated with lactation: Secondary | ICD-10-CM | POA: Diagnosis not present

## 2017-07-10 DIAGNOSIS — N6452 Nipple discharge: Secondary | ICD-10-CM | POA: Diagnosis not present

## 2017-07-18 DIAGNOSIS — Z6820 Body mass index (BMI) 20.0-20.9, adult: Secondary | ICD-10-CM | POA: Diagnosis not present

## 2017-07-18 DIAGNOSIS — N63 Unspecified lump in unspecified breast: Secondary | ICD-10-CM | POA: Diagnosis not present

## 2017-07-21 ENCOUNTER — Other Ambulatory Visit: Payer: Self-pay | Admitting: Obstetrics and Gynecology

## 2017-07-21 DIAGNOSIS — N644 Mastodynia: Secondary | ICD-10-CM

## 2017-11-06 DIAGNOSIS — Z681 Body mass index (BMI) 19 or less, adult: Secondary | ICD-10-CM | POA: Diagnosis not present

## 2017-11-06 DIAGNOSIS — N63 Unspecified lump in unspecified breast: Secondary | ICD-10-CM | POA: Diagnosis not present

## 2017-12-30 DIAGNOSIS — O9123 Nonpurulent mastitis associated with lactation: Secondary | ICD-10-CM | POA: Diagnosis not present

## 2017-12-30 DIAGNOSIS — Z681 Body mass index (BMI) 19 or less, adult: Secondary | ICD-10-CM | POA: Diagnosis not present

## 2018-01-14 ENCOUNTER — Telehealth (HOSPITAL_COMMUNITY): Payer: Self-pay | Admitting: Lactation Services

## 2018-01-14 NOTE — Telephone Encounter (Signed)
Patient called & left a message stating that she needed to rent an electric breast pump ASAP. I returned patient's call & gave her the appropriate phone number for renting breast pumps. Patient shared with me that she is undergoing her 7th bout of mastitis (in the absence of oversupply) and that the antibiotics are starting to fail. Her OB/GYN thinks it may be secondary to the flora in the baby's mouth, so patient is going to start exclusively pumping & BO.   Patient is already eating a hypoallergenic diet b/c her baby (almost 44 year old) has FPIES & colitis & has failed 4 different amino acid-based formulas.    Patient wanted to know if I could think of anything else that might assist her. I gave the phone number for [facility] at [facility] [phone]).   Madeline Dodge, RN, IBCLC

## 2018-01-16 DIAGNOSIS — N61 Mastitis without abscess: Secondary | ICD-10-CM | POA: Diagnosis not present

## 2018-01-16 DIAGNOSIS — Z681 Body mass index (BMI) 19 or less, adult: Secondary | ICD-10-CM | POA: Diagnosis not present

## 2018-01-20 DIAGNOSIS — O9122 Nonpurulent mastitis associated with the puerperium: Secondary | ICD-10-CM | POA: Diagnosis not present

## 2018-03-24 DIAGNOSIS — Z681 Body mass index (BMI) 19 or less, adult: Secondary | ICD-10-CM | POA: Diagnosis not present

## 2018-03-24 DIAGNOSIS — L608 Other nail disorders: Secondary | ICD-10-CM | POA: Diagnosis not present

## 2018-06-11 ENCOUNTER — Ambulatory Visit: Payer: BLUE CROSS/BLUE SHIELD | Admitting: Family Medicine

## 2018-06-11 ENCOUNTER — Encounter: Payer: Self-pay | Admitting: Family Medicine

## 2018-06-11 VITALS — BP 106/71 | HR 78 | Temp 98.3°F | Resp 16 | Ht 64.0 in | Wt 105.2 lb

## 2018-06-11 DIAGNOSIS — R591 Generalized enlarged lymph nodes: Secondary | ICD-10-CM

## 2018-06-11 NOTE — Patient Instructions (Signed)
Lymphadenopathy Lymphadenopathy refers to swollen or enlarged lymph glands, also called lymph nodes. Lymph glands are part of your body's defense (immune) system, which protects the body from infections, germs, and diseases. Lymph glands are found in many locations in your body, including the neck, underarm, and groin. Many things can cause lymph glands to become enlarged. When your immune system responds to germs, such as viruses or bacteria, infection-fighting cells and fluid build up. This causes the glands to grow in size. Usually, this is not something to worry about. The swelling and any soreness often go away without treatment. However, swollen lymph glands can also be caused by a number of diseases. Your health care provider may do various tests to help determine the cause. If the cause of your swollen lymph glands cannot be found, it is important to monitor your condition to make sure the swelling goes away. Follow these instructions at home: Watch your condition for any changes. The following actions may help to lessen any discomfort you are feeling:  Get plenty of rest.  Take medicines only as directed by your health care provider. Your health care provider may recommend over-the-counter medicines for pain.  Apply moist heat compresses to the site of swollen lymph nodes as directed by your health care provider. This can help reduce any pain.  Check your lymph nodes daily for any changes.  Keep all follow-up visits as directed by your health care provider. This is important.  Contact a health care provider if:  Your lymph nodes are still swollen after 2 weeks.  Your swelling increases or spreads to other areas.  Your lymph nodes are hard, seem fixed to the skin, or are growing rapidly.  Your skin over the lymph nodes is red and inflamed.  You have a fever.  You have chills.  You have fatigue.  You develop a sore throat.  You have abdominal pain.  You have weight  loss.  You have night sweats. Get help right away if:  You notice fluid leaking from the area of the enlarged lymph node.  You have severe pain in any area of your body.  You have chest pain.  You have shortness of breath. This information is not intended to replace advice given to you by your health care provider. Make sure you discuss any questions you have with your health care provider. Document Released: 04/23/2008 Document Revised: 12/21/2015 Document Reviewed: 02/17/2014 Elsevier Interactive Patient Education  2018 Elsevier Inc.  

## 2018-06-11 NOTE — Progress Notes (Signed)
Patient ID: Madeline Flores, female    DOB: 1976-05-23  Age: 42 y.o. MRN: [id number]    Subjective:  Subjective  HPI Madeline Flores presents for lump L side of her neck.  nontender  - she is getting over mastitis  Pt is concerned about lymphoma.    Review of Systems  Constitutional: Negative for activity change, appetite change, diaphoresis, fatigue and unexpected weight change.  Eyes: Negative for pain, redness and visual disturbance.  Respiratory: Negative for cough, chest tightness, shortness of breath and wheezing.   Cardiovascular: Negative for chest pain, palpitations and leg swelling.  Endocrine: Negative for cold intolerance, heat intolerance, polydipsia, polyphagia and polyuria.  Genitourinary: Negative for difficulty urinating, dysuria and frequency.  Neurological: Negative for dizziness, light-headedness, numbness and headaches.  Hematological: Positive for adenopathy.  Psychiatric/Behavioral: Negative for behavioral problems and dysphoric mood. The patient is not nervous/anxious.     History Past Medical History:  Diagnosis Date  . Celiac sprue   . Connective tissue disease ([place])   . Diabetes mellitus without complication ([place])   . Gestational diabetes   . Heart murmur   . Osteopenia     She has a past surgical history that includes Cesarean section; Knee arthroscopy; Wisdom tooth extraction; and Cesarean section (N/A, 01/27/2017).   Her family history includes Asthma in her brother; Heart disease in her mother; Hyperlipidemia in her father and mother; Hypertension in her father and mother.She reports that she has never smoked. She has never used smokeless tobacco. She reports that she does not drink alcohol or use drugs.  Current Outpatient Medications on File Prior to Visit  Medication Sig Dispense Refill  . hydrocortisone cream 0.5 % Apply 1 application topically daily as needed for itching.  30 g 0  . Prenat w/o [id number] (CITRANATAL ASSURE) 35-1 & 300 MG  tablet TK 1 PACKET QD FOR 30 DAYS  4   No current facility-administered medications on file prior to visit.      Objective:  Objective  Physical Exam  Constitutional: She is oriented to person, place, and time. She appears well-developed and well-nourished.  HENT:  Head: Normocephalic and atraumatic.  Eyes: Conjunctivae and EOM are normal.  Neck: Normal range of motion. Neck supple. No JVD present. Carotid bruit is not present. No thyromegaly present.    Cardiovascular: Normal rate, regular rhythm and normal heart sounds.  No murmur heard. Pulmonary/Chest: Effort normal and breath sounds normal. No respiratory distress. She has no wheezes. She has no rales. She exhibits no tenderness.  Musculoskeletal: She exhibits no edema.  Lymphadenopathy:    She has cervical adenopathy.  Neurological: She is alert and oriented to person, place, and time.  Psychiatric: She has a normal mood and affect.  Nursing note and vitals reviewed.  BP 106/71 (BP Location: Right Arm, Cuff Size: Normal)   Pulse 78   Temp 98.3 F (36.8 C) (Oral)   Resp 16   Ht 5' 4"  (1.626 m)   Wt 105 lb 3.2 oz (47.7 kg)   SpO2 100%   Breastfeeding? Yes   BMI 18.06 kg/m  Wt Readings from Last 3 Encounters:  06/11/18 105 lb 3.2 oz (47.7 kg)  01/27/17 133 lb (60.3 kg)  01/13/17 132 lb (59.9 kg)     Lab Results  Component Value Date   WBC 11.0 (H) 01/28/2017   HGB 13.3 01/28/2017   HCT 37.7 01/28/2017   PLT 117 (L) 01/28/2017   GLUCOSE 85 09/14/2014   CHOL 175 09/14/2014  TRIG 51.0 09/14/2014   HDL 65.00 09/14/2014   LDLDIRECT 141.6 05/01/2010   LDLCALC 100 (H) 09/14/2014   ALT 15 09/14/2014   AST 21 09/14/2014   NA 137 09/14/2014   K 4.0 09/14/2014   CL 105 09/14/2014   CREATININE 0.81 09/14/2014   BUN 10 09/14/2014   CO2 25 09/14/2014   TSH 1.42 09/14/2014    No results found.   Assessment & Plan:  Plan  I have discontinued Dariann A. Palm's ELETONE, loperamide, and oxyCODONE-acetaminophen. I  am also having her maintain her CITRANATAL ASSURE and hydrocortisone cream.  No orders of the defined types were placed in this encounter.   Problem List Items Addressed This Visit    None    Visit Diagnoses    Lymphadenopathy    -  Primary   Relevant Orders   CBC with Differential/Platelet   Comprehensive metabolic panel    pt has dicloxicillin at home-- ok to take that  Check labs  If no better consider [place] vs [place]  Follow-up: Return if symptoms worsen or fail to improve.  Ann Held, DO

## 2018-06-12 ENCOUNTER — Other Ambulatory Visit (INDEPENDENT_AMBULATORY_CARE_PROVIDER_SITE_OTHER): Payer: BLUE CROSS/BLUE SHIELD

## 2018-06-12 DIAGNOSIS — R591 Generalized enlarged lymph nodes: Secondary | ICD-10-CM | POA: Diagnosis not present

## 2018-06-12 LAB — COMPREHENSIVE METABOLIC PANEL
ALBUMIN: 4.6 g/dL (ref 3.5–5.2)
ALK PHOS: 111 U/L (ref 39–117)
ALT: 19 U/L (ref 0–35)
AST: 26 U/L (ref 0–37)
BUN: 12 mg/dL (ref 6–23)
CALCIUM: 9.1 mg/dL (ref 8.4–10.5)
CHLORIDE: 100 meq/L (ref 96–112)
CO2: 30 mEq/L (ref 19–32)
CREATININE: 0.85 mg/dL (ref 0.40–1.20)
GFR: 77.61 mL/min (ref >60.00)
Glucose, Bld: 85 mg/dL (ref 70–99)
POTASSIUM: 4.6 meq/L (ref 3.5–5.1)
Sodium: 137 mEq/L (ref 135–145)
TOTAL PROTEIN: 7 g/dL (ref 6.0–8.3)
Total Bilirubin: 0.5 mg/dL (ref 0.2–1.2)

## 2018-06-12 LAB — CBC WITH DIFFERENTIAL/PLATELET
Basophils Absolute: 0 10*3/uL (ref 0.0–0.1)
Basophils Relative: 0.9 % (ref 0.0–3.0)
Eosinophils Absolute: 0 10*3/uL (ref 0.0–0.7)
Eosinophils Relative: 1.1 % (ref 0.0–5.0)
HEMATOCRIT: 41.4 % (ref 36.0–46.0)
HEMOGLOBIN: 14 g/dL (ref 12.0–15.0)
Lymphocytes Relative: 27.8 % (ref 12.0–46.0)
Lymphs Abs: 1.1 10*3/uL (ref 0.7–4.0)
MCHC: 33.9 g/dL (ref 30.0–36.0)
MCV: 95.5 fl (ref 78.0–100.0)
MONOS PCT: 17.4 % — AB (ref 3.0–12.0)
Monocytes Absolute: 0.7 10*3/uL (ref 0.1–1.0)
NEUTROS ABS: 2.1 10*3/uL (ref 1.4–7.7)
Neutrophils Relative %: 52.8 % (ref 43.0–77.0)
Platelets: 154 10*3/uL (ref 150.0–400.0)
RBC: 4.34 Mil/uL (ref 3.87–5.11)
RDW: 13 % (ref 11.5–15.5)
WBC: 4 10*3/uL (ref 4.0–10.5)

## 2018-06-14 ENCOUNTER — Encounter: Payer: Self-pay | Admitting: Family Medicine

## 2018-06-15 ENCOUNTER — Other Ambulatory Visit: Payer: Self-pay | Admitting: Family Medicine

## 2018-06-15 DIAGNOSIS — R590 Localized enlarged lymph nodes: Secondary | ICD-10-CM

## 2018-06-17 ENCOUNTER — Encounter: Payer: Self-pay | Admitting: Family Medicine

## 2018-06-18 ENCOUNTER — Ambulatory Visit (HOSPITAL_BASED_OUTPATIENT_CLINIC_OR_DEPARTMENT_OTHER)
Admission: RE | Admit: 2018-06-18 | Discharge: 2018-06-18 | Disposition: A | Discharge status: Home / Self Care | Payer: BLUE CROSS/BLUE SHIELD | Source: Ambulatory Visit | Attending: Family Medicine | Admitting: Family Medicine

## 2018-06-18 DIAGNOSIS — R590 Localized enlarged lymph nodes: Secondary | ICD-10-CM | POA: Diagnosis not present

## 2018-06-18 DIAGNOSIS — R221 Localized swelling, mass and lump, neck: Secondary | ICD-10-CM | POA: Diagnosis not present

## 2018-06-18 NOTE — Telephone Encounter (Signed)
i'm so sorry---  Hope you feel better soon

## 2018-06-30 DIAGNOSIS — N63 Unspecified lump in unspecified breast: Secondary | ICD-10-CM | POA: Diagnosis not present

## 2018-06-30 DIAGNOSIS — Z681 Body mass index (BMI) 19 or less, adult: Secondary | ICD-10-CM | POA: Diagnosis not present

## 2018-07-09 DIAGNOSIS — E049 Nontoxic goiter, unspecified: Secondary | ICD-10-CM | POA: Diagnosis not present

## 2018-07-09 DIAGNOSIS — Z124 Encounter for screening for malignant neoplasm of cervix: Secondary | ICD-10-CM | POA: Diagnosis not present

## 2018-08-04 IMAGING — US US MFM OB DETAIL+14 WK
1 series · 14 of 28 positions shown · non-contrast
Comparison: none

[Series 1: us mfm ob detail+14 wk · 80 acquisitions, 14 frames shown]
[im 3/80]
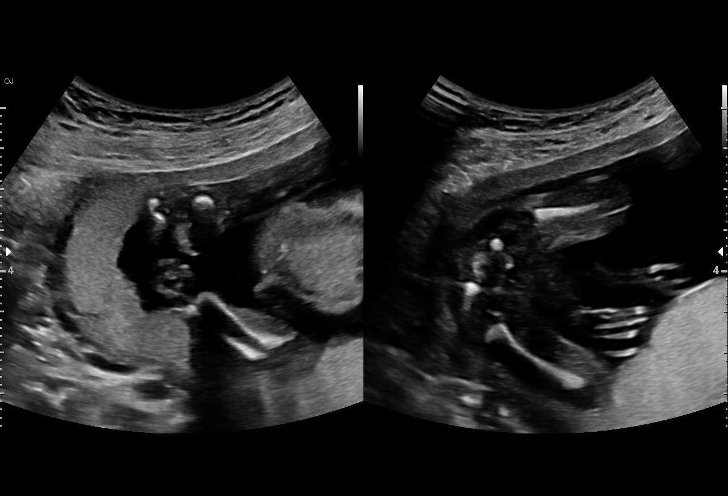
[im 9/80]
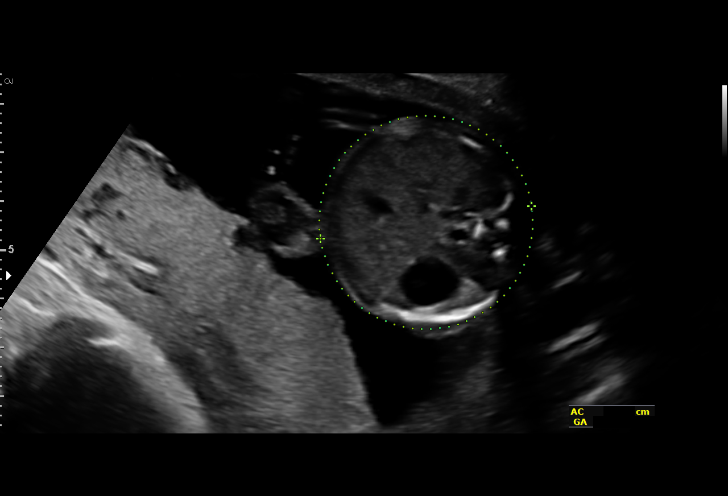
[im 15/80]
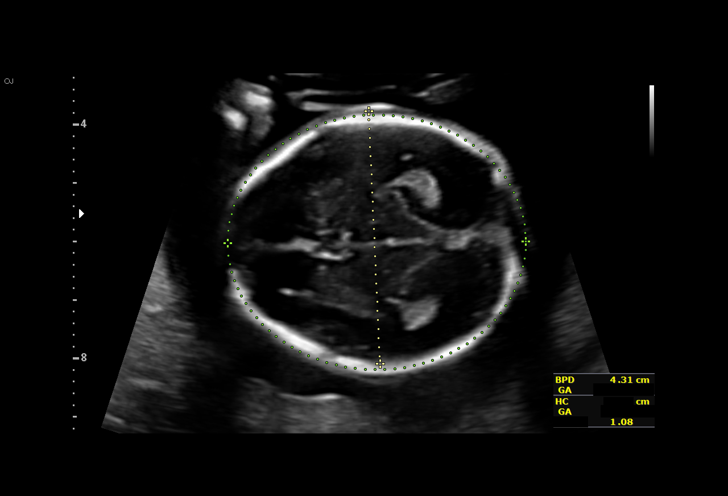
[im 21/80]
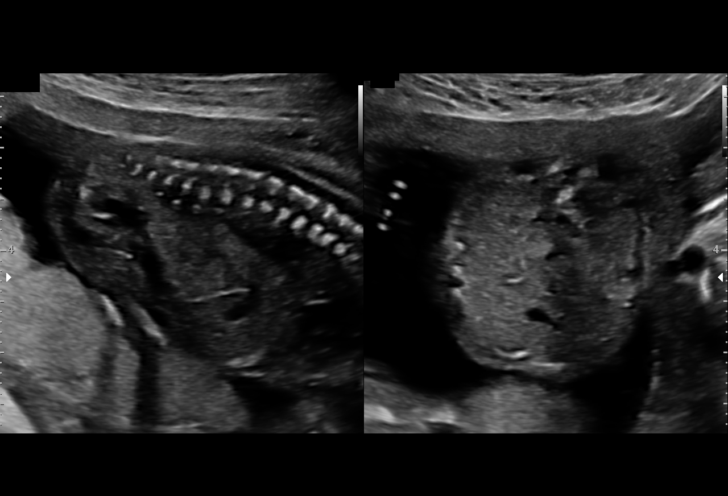
[im 27/80]
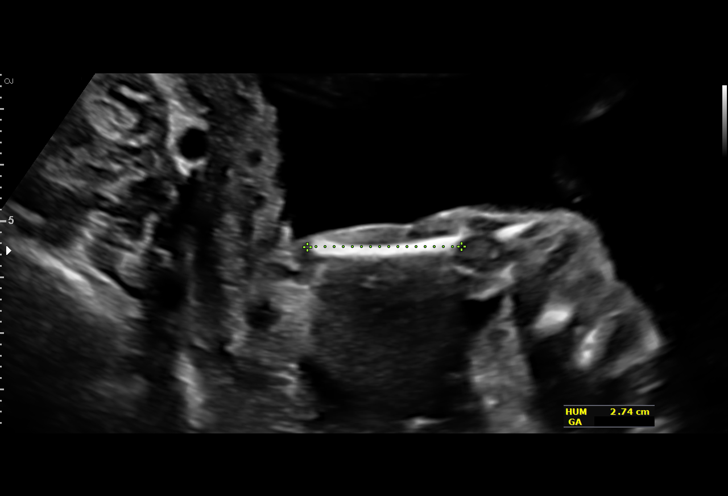
[im 33/80]
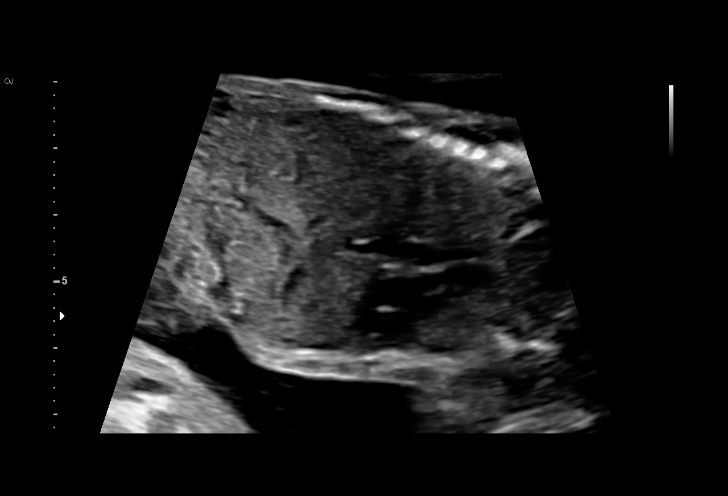
[im 39/80]
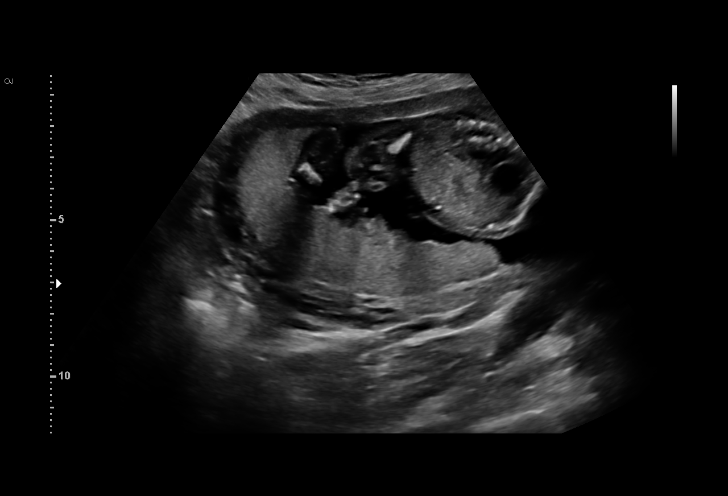
[im 44/80]
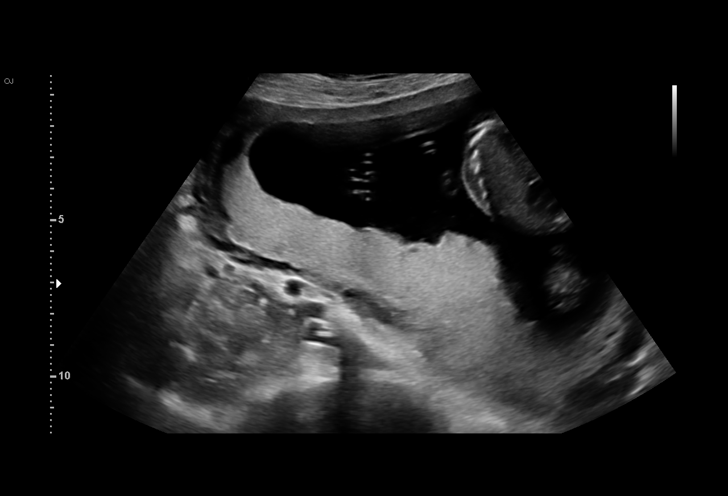
[im 50/80]
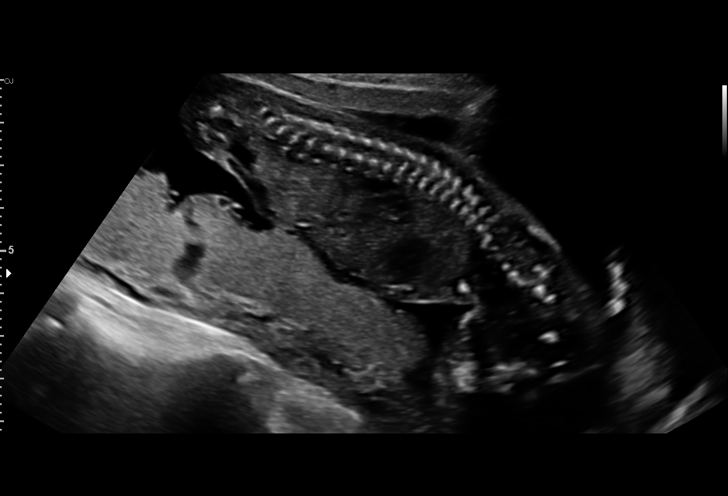
[im 56/80]
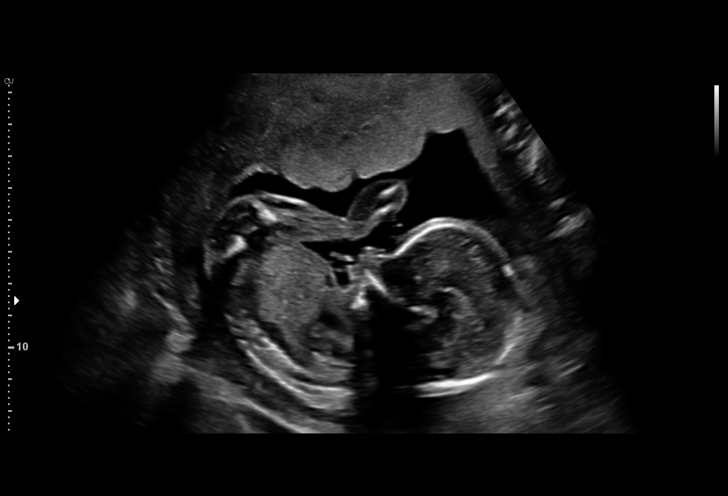
[im 62/80]
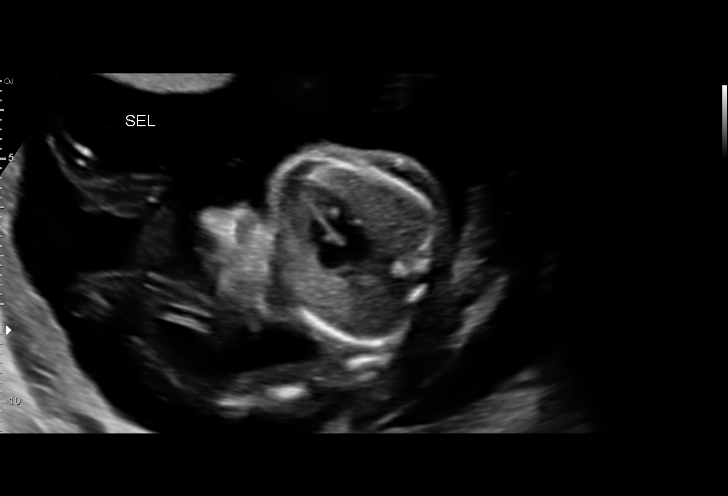
[im 68/80]
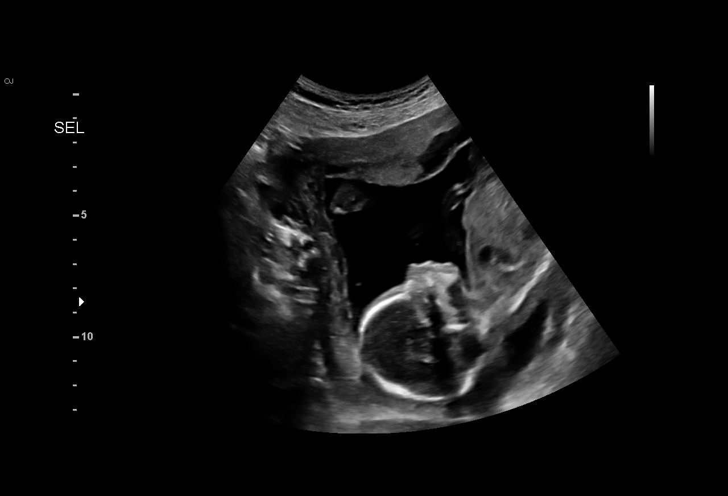
[im 74/80]
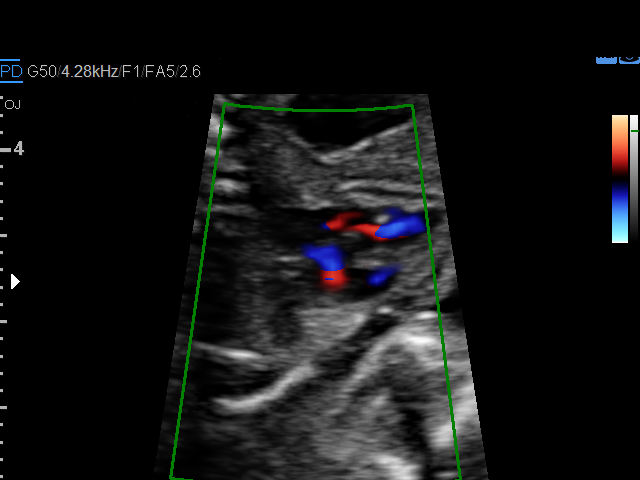
[im 80/80]
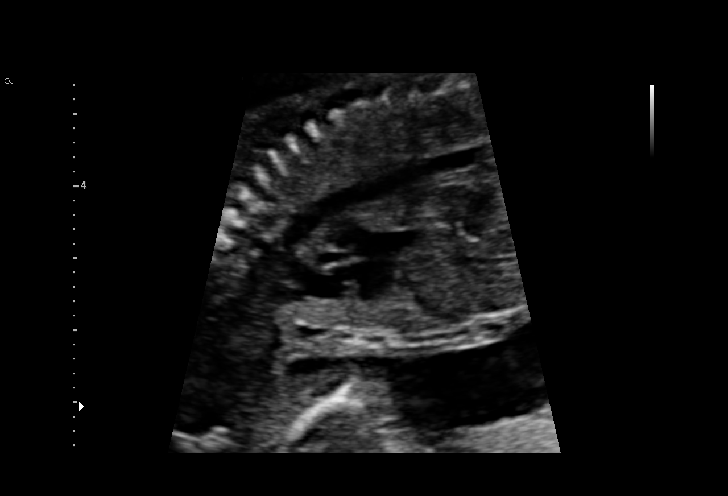

[14 of 28 positions shown; findings below may reference images not displayed]

Road; [HOSPITAL]

1  QOMANDAN TIGER            [id number]      [id number]     [id number]
Indications

18 weeks gestation of pregnancy
Encounter for antenatal screening for
malformations
Advanced maternal age multigravida (40),
second trimester; low risk NIPS
OB History

Blood Type:            Height:  5'5"   Weight (lb):  113      BMI:
Gravidity:    2         Term:   1        Prem:   0        SAB:   0
TOP:          0       Ectopic:  0        Living: 1
Fetal Evaluation

Num Of Fetuses:     1
Fetal Heart         149
Rate(bpm):
Cardiac Activity:   Observed
Presentation:       Transverse, head to maternal right
Placenta:           Posterior, above cervical os
P. Cord Insertion:  Visualized

Amniotic Fluid
AFI FV:      Subjectively within normal limits

Largest Pocket(cm)
2.8
Biometry

BPD:        43  mm     G. Age:  19w 0d         85  %    CI:        82.14   %   70 - 86
FL/HC:      17.4   %   15.8 - 18
HC:      149.7  mm     G. Age:  18w 0d         38  %    HC/AC:      1.10       1.07 -
AC:      136.6  mm     G. Age:  19w 0d         77  %    FL/BPD:     60.7   %
FL:       26.1  mm     G. Age:  18w 0d         37  %    FL/AC:      19.1   %   20 - 24
HUM:      27.6  mm     G. Age:  18w 6d         74  %
CER:      19.8  mm     G. Age:  18w 6d         74  %
NFT:       3.7  mm

CM:        4.7  mm
Est. FW:     244  gm      0 lb 9 oz     55  %
Gestational Age

LMP:           18w 1d       Date:   04/25/16                 EDD:   01/30/17
U/S Today:     18w 4d                                        EDD:   01/27/17
Best:          18w 1d    Det. By:   LMP  (04/25/16)          EDD:   01/30/17
Anatomy

Cranium:               Appears normal         Aortic Arch:            Appears normal
Cavum:                 Appears normal         Ductal Arch:            Not well visualized
Ventricles:            Appears normal         Diaphragm:              Appears normal
Choroid Plexus:        Appears normal         Stomach:                Appears normal, left
sided
Cerebellum:            Appears normal         Abdomen:                Appears normal
Posterior Fossa:       Appears normal         Abdominal Wall:         Appears nml (cord
insert, abd wall)
Nuchal Fold:           Appears normal         Cord Vessels:           Appears normal (3
vessel cord)
Face:                  Appears normal         Kidneys:                Appear normal
(orbits and profile)
Lips:                  Appears normal         Bladder:                Appears normal
Thoracic:              Appears normal         Spine:                  Limited views
appear normal
Heart:                 Not well visualized    Upper Extremities:      Appears normal
RVOT:                  Not well visualized    Lower Extremities:      Appears normal
LVOT:                  Appears normal

Other:  Female gender. Heels visualized. Technically difficult due to fetal
position.
Cervix Uterus Adnexa

Cervix
Length:            3.7  cm.
Normal appearance by transabdominal scan.

Uterus
No abnormality visualized.

Left Ovary
Not visualized. No adnexal mass visualized.

Right Ovary
Within normal limits.
Impression

SIUP at 18+1 weeks
Normal detailed fetal anatomy; limited views of 4-chamber,
RVOT and DA
Markers of aneuploidy: none
Normal amniotic fluid volume
Measurements consistent with LMP dating
Recommendations

Follow-up ultrasound in 4 weeks to complete anatomy survey

## 2019-07-30 HISTORY — PX: DENTAL SURGERY: SHX609

## 2020-03-03 NOTE — Progress Notes (Signed)
Office Visit Note  Patient: Madeline Flores             Date of Birth: Dec 31, 1975           MRN: [id number]             PCP: Scheryl Marten, PA Referring: Ann Held, * Visit Date: 03/14/2020 Occupation: _0 @  Subjective:  raynauds phenomenon.   History of Present Illness: Madeline Flores is a 44 y.o. female seen in consultation per request of Dr. Philis Pique.  According to [facility] her symptoms a started as a child.  She recalls seeing a rheumatologist when she was 71 and was diagnosed with primary Raynaud's.  She states she had neutropenia and Raynaud's in her 61s.  Over time she developed positive ANA, double-stranded DNA and positive RNP.  5 years ago she was seen by Dr. Trudie Reed who diagnosed her with autoimmune disease and placed her on Plaquenil.  She took Plaquenil for about 6 months and then discontinued as she did not notice any improvement in her symptoms.  She continues to have fatigue, photosensitivity, rashes off and on.  She recalls about 5 years ago she had a rash on her back in some time she had purple splotches she had 3 episodes of those.  She continues to have some joint pain in her hands she works as a Community education officer.  She describes pain in her left thumb when she is working.  She has been suffering from dry hands.  She has had recurrent paronychia in her fingers.  She is currently taking doxycycline for that.  She has noticed increased redness and drying around her fingernails.  She takes Advil for discomfort in her hands.  She was recently seen by Dr. Leonie Green at [facility], dermatology department who diagnosed her with psoriasis, rosacea and perioral dermatitis.  She was given some topical agents to try.  She is also noticed some hair loss which her dermatologist felt was age-related.  She is gravida 2, para 2 and miscarriages 0.  There is family history of sarcoidosis in her mother.  Activities of Daily Living:  Patient reports morning stiffness  for 0 minutes.   Patient Denies nocturnal pain.  Difficulty dressing/grooming: Denies Difficulty climbing stairs: Denies Difficulty getting out of chair: Denies Difficulty using hands for taps, buttons, cutlery, and/or writing: Denies  Review of Systems  Constitutional: Positive for fatigue. Negative for night sweats, weight gain and weight loss.  HENT: Negative for mouth sores, trouble swallowing, trouble swallowing, mouth dryness and nose dryness.   Eyes: Negative for pain, redness, itching, visual disturbance and dryness.  Respiratory: Negative for cough, shortness of breath and difficulty breathing.   Cardiovascular: Positive for palpitations. Negative for chest pain, hypertension, irregular heartbeat and swelling in legs/feet.  Gastrointestinal: Negative for blood in stool, constipation and diarrhea.  Endocrine: Negative for increased urination.  Genitourinary: Negative for difficulty urinating and vaginal dryness.  Musculoskeletal: Positive for arthralgias and joint pain. Negative for joint swelling, myalgias, muscle weakness, morning stiffness, muscle tenderness and myalgias.  Skin: Positive for color change, rash and hair loss. Negative for skin tightness, ulcers and sensitivity to sunlight.  Allergic/Immunologic: Negative for susceptible to infections.  Neurological: Positive for weakness. Negative for dizziness, numbness, headaches, memory loss and night sweats.  Hematological: Negative for bruising/bleeding tendency and swollen glands.  Psychiatric/Behavioral: Positive for sleep disturbance. Negative for depressed mood and confusion. The patient is nervous/anxious.     PMFS History:  Patient Active  Problem List   Diagnosis Date Noted  . Psoriasis 03/14/2020  . Pregnancy 01/27/2017  . Advanced maternal age in multigravida 07/04/2016  . Paronychia of finger 11/09/2015  . OTHER SEBORRHEIC DERMATITIS 05/01/2010  . Palpitations 05/01/2010  . CERVICAL LYMPHADENOPATHY 12/01/2008    . PERSONAL HISTORY CONTACT WITH & EXPOSURE TO LEAD 12/01/2008  . GERD 02/04/2008  . SINUSITIS- ACUTE-NOS 08/05/2007  . ANXIETY STATE NOS 03/16/2007  . NAUSEA, CHRONIC 03/16/2007  . NEUTROPENIA NOS 11/18/2006  . RAYNAUD'S SYNDROME 11/18/2006  . CELIAC SPRUE 11/18/2006  . OSTEOPENIA 11/18/2006  . ANA POSITIVE 11/18/2006    Past Medical History:  Diagnosis Date  . Celiac sprue   . Connective tissue disease ([place])   . Diabetes mellitus without complication ([place])   . Gestational diabetes   . Heart murmur   . Osteopenia   . Psoriasis    per patient   . Rosacea    per patient     Family History  Problem Relation Age of Onset  . Heart disease Mother   . Hyperlipidemia Mother   . Hypertension Mother   . Sarcoidosis Mother   . Asthma Brother   . Hyperlipidemia Father   . Hypertension Father   . Healthy Daughter    Past Surgical History:  Procedure Laterality Date  . BARTHOLIN GLAND CYST EXCISION    . CESAREAN SECTION  2010  . CESAREAN SECTION N/A 01/27/2017   Procedure: REPEAT CESAREAN SECTION;  Surgeon: Olga Millers, MD;  Location: [place];  Service: Obstetrics;  Laterality: N/A;  . DENTAL SURGERY  2021  . KNEE ARTHROSCOPY    . WISDOM TOOTH EXTRACTION     Social History   Social History Narrative  . Not on file   Immunization History  Administered Date(s) Administered  . Influenza Whole 05/01/2010  . Influenza,inj,Quad PF,6+ Mos 06/04/2018  . PPD Test 10/27/2012  . Td 05/01/2010     Objective: Vital Signs: BP 119/83 (BP Location: Right Arm, Patient Position: Sitting, Cuff Size: Normal)   Pulse 96   Resp 13   Ht _0  (1.6 m)   Wt 112 lb 9.6 oz (51.1 kg)   BMI 19.95 kg/m    Physical Exam Vitals and nursing note reviewed.  Constitutional:      Appearance: She is well-developed.  HENT:     Head: Normocephalic and atraumatic.  Eyes:     Conjunctiva/sclera: Conjunctivae normal.  Cardiovascular:     Rate and Rhythm: Normal rate and regular  rhythm.     Heart sounds: Normal heart sounds.  Pulmonary:     Effort: Pulmonary effort is normal.     Breath sounds: Normal breath sounds.  Abdominal:     General: Bowel sounds are normal.     Palpations: Abdomen is soft.  Musculoskeletal:     Cervical back: Normal range of motion.  Lymphadenopathy:     Cervical: No cervical adenopathy.  Skin:    General: Skin is warm and dry.     Capillary Refill: Capillary refill takes more than 3 seconds.  Neurological:     Mental Status: She is alert and oriented to person, place, and time.  Psychiatric:        Behavior: Behavior normal.      Musculoskeletal Exam: C-spine thoracic and lumbar spine with good range of motion.  Shoulder joints, elbow joints, wrist joints, MCPs PIPs and DIPs with good range of motion.  She had paronychia in her left fourth fingernail.  Hip joints, knee  joints, ankles, MTPs and PIPs with good range of motion with no synovitis.  CDAI Exam: CDAI Score: -- Patient Global: --; Provider Global: -- Swollen: --; Tender: -- Joint Exam 03/14/2020   No joint exam has been documented for this visit   There is currently no information documented on the homunculus. Go to the Rheumatology activity and complete the homunculus joint exam.  Investigation: No additional findings.  Imaging: No results found.  Recent Labs: Lab Results  Component Value Date   WBC 4.0 06/12/2018   HGB 14.0 06/12/2018   PLT 154.0 06/12/2018   NA 137 06/12/2018   K 4.6 06/12/2018   CL 100 06/12/2018   CO2 30 06/12/2018   GLUCOSE 85 06/12/2018   BUN 12 06/12/2018   CREATININE 0.85 06/12/2018   BILITOT 0.5 06/12/2018   ALKPHOS 111 06/12/2018   AST 26 06/12/2018   ALT 19 06/12/2018   PROT 7.0 06/12/2018   ALBUMIN 4.6 06/12/2018   CALCIUM 9.1 06/12/2018   GFRAA  04/14/2009    >60        The eGFR has been calculated using the MDRD equation. This calculation has not been validated in all clinical situations. eGFR's  persistently <60 mL/min signify possible Chronic Kidney Disease.    Speciality Comments: No specialty comments available.   Dec 10, 2019 ANA positive, RNP positive, dsDNA positive, (Smith SCL 70, SSA, SSB, Jo 1, anticentromere, antichromatin negative)  Procedures:  No procedures performed Allergies: Clarithromycin, Gluten meal, Other, and Wheat bran   Assessment / Plan:     Visit Diagnoses: SLE (systemic lupus erythematosus related syndrome) ([place]) - 04/19/16: ANA 1:320H, C4 20, dsDNA 8, RNP 1.4, Smith-, Scl-70-, Ro-, La-.  Patient has history of Raynaud's phenomenon, arthralgias, hair loss, fatigue.  She has tried Plaquenil in the past for 6 months and discontinued as she did not notice any improvement.  She needs CBC, CMP, UA, sed rate, complements, anticardiolipin, beta-2, lupus anticoagulant, ANA titer, pan-ANCA, cryoglobulins, G6PD.  Patient states she had recent labs done by her GYN.  Once we get lab results then we can order labs.  We also had the tail discussion regarding Plaquenil.  She would be able to start Plaquenil once she has lab results available.  High risk medication use - PLQ in the past, pt has not seen rheumatologist since 2017-Dr. Hawkes  Other neutropenia (HCC)-I do not have any recent labs available.  Patient had labs with her GYN and she will forward labs to me.  Raynaud's syndrome without gangrene-keeping body temperature warm was emphasized.  She has tried calcium channel blockers in the past but could not tolerate due to drop in her blood pressure.  We discussed use of topical nitroglycerin ointment.  Pain in both hands -she complains of discomfort in her hands.  Plan: XR Hand 2 View Right, XR Hand 2 View Left. X-ray of bilateral hands were unremarkable.  Pain in both feet- she has intermittent discomfort in her feet but denies any Raynaud's.- Plan: XR Foot 2 Views Right, XR Foot 2 Views Left. X-ray of bilateral feet were unremarkable.  Celiac sprue - Diagnosed at  age 21.  She is on gluten-free diet.  She has had endoscopy x2.  Psoriasis-she had psoriasis patches on her elbows and her face.  She has been followed by dermatologist.  History of rosacea-diagnosed by dermatologist.  Other seborrheic dermatitis  Family history of sarcoidosis - Mother  Paronychia of finger of left hand-she is having recurrent paronychia of her hands.  Have advised not to do any manicure.  She should also use Aquaphor on a regular basis.  Orders: Orders Placed This Encounter  Procedures  . XR Hand 2 View Right  . XR Hand 2 View Left  . XR Foot 2 Views Right  . XR Foot 2 Views Left   No orders of the defined types were placed in this encounter.   .  Follow-Up Instructions: Return for Systemic lupus.   Bo Merino, MD  Note - This record has been created using Editor, commissioning.  Chart creation errors have been sought, but may not always  have been located. Such creation errors do not reflect on  the standard of medical care.

## 2020-03-14 ENCOUNTER — Ambulatory Visit (INDEPENDENT_AMBULATORY_CARE_PROVIDER_SITE_OTHER): Payer: BLUE CROSS/BLUE SHIELD | Admitting: Rheumatology

## 2020-03-14 ENCOUNTER — Ambulatory Visit: Payer: Self-pay

## 2020-03-14 ENCOUNTER — Other Ambulatory Visit: Payer: Self-pay

## 2020-03-14 ENCOUNTER — Encounter: Payer: Self-pay | Admitting: Rheumatology

## 2020-03-14 VITALS — BP 119/83 | HR 96 | Resp 13 | Ht 63.0 in | Wt 112.6 lb

## 2020-03-14 DIAGNOSIS — D708 Other neutropenia: Secondary | ICD-10-CM | POA: Diagnosis not present

## 2020-03-14 DIAGNOSIS — I7300 Raynaud's syndrome without gangrene: Secondary | ICD-10-CM | POA: Diagnosis not present

## 2020-03-14 DIAGNOSIS — M329 Systemic lupus erythematosus, unspecified: Secondary | ICD-10-CM

## 2020-03-14 DIAGNOSIS — K900 Celiac disease: Secondary | ICD-10-CM

## 2020-03-14 DIAGNOSIS — M79672 Pain in left foot: Secondary | ICD-10-CM

## 2020-03-14 DIAGNOSIS — M79641 Pain in right hand: Secondary | ICD-10-CM | POA: Diagnosis not present

## 2020-03-14 DIAGNOSIS — M79642 Pain in left hand: Secondary | ICD-10-CM | POA: Diagnosis not present

## 2020-03-14 DIAGNOSIS — Z79899 Other long term (current) drug therapy: Secondary | ICD-10-CM | POA: Diagnosis not present

## 2020-03-14 DIAGNOSIS — Z832 Family history of diseases of the blood and blood-forming organs and certain disorders involving the immune mechanism: Secondary | ICD-10-CM

## 2020-03-14 DIAGNOSIS — L218 Other seborrheic dermatitis: Secondary | ICD-10-CM

## 2020-03-14 DIAGNOSIS — M79671 Pain in right foot: Secondary | ICD-10-CM

## 2020-03-14 DIAGNOSIS — L409 Psoriasis, unspecified: Secondary | ICD-10-CM

## 2020-03-14 DIAGNOSIS — Z872 Personal history of diseases of the skin and subcutaneous tissue: Secondary | ICD-10-CM

## 2020-03-14 DIAGNOSIS — L03012 Cellulitis of left finger: Secondary | ICD-10-CM

## 2020-03-23 ENCOUNTER — Encounter: Payer: Self-pay | Admitting: Rheumatology

## 2020-04-07 NOTE — Progress Notes (Signed)
Office Visit Note  Patient: Madeline Flores             Date of Birth: 1975-09-01           MRN: [id number]             PCP: Scheryl Marten, PA Referring: Ann Held, * Visit Date: 04/13/2020 Occupation: @GUAROCC @  Subjective:  Fatigue.   History of Present Illness: Madeline Flores is a 44 y.o. female with systemic lupus erythematosus.  She states she has very mild symptoms.  She continues to have fatigue, joint pain, photosensitivity, Raynaud's phenomenon and hair loss.  She had tried Plaquenil in the past and stopped it.  She states she is currently taking doxycycline for left fourth fingernail infection.  Activities of Daily Living:  Patient reports morning stiffness for 0 minutes.   Patient Denies nocturnal pain.  Difficulty dressing/grooming: Denies Difficulty climbing stairs: Denies Difficulty getting out of chair: Denies Difficulty using hands for taps, buttons, cutlery, and/or writing: Denies  Review of Systems  Constitutional: Positive for fatigue.  HENT: Negative for mouth sores, mouth dryness and nose dryness.   Eyes: Negative for pain, itching and dryness.  Respiratory: Negative for shortness of breath and difficulty breathing.   Cardiovascular: Negative for chest pain and palpitations.  Gastrointestinal: Negative for blood in stool, constipation and diarrhea.  Endocrine: Negative for increased urination.  Genitourinary: Negative for difficulty urinating.  Musculoskeletal: Negative for arthralgias, joint pain, joint swelling, myalgias, morning stiffness, muscle tenderness and myalgias.  Skin: Positive for color change and rash.  Allergic/Immunologic: Negative for susceptible to infections.  Neurological: Negative for dizziness, numbness, headaches, memory loss and weakness.  Hematological: Negative for bruising/bleeding tendency.  Psychiatric/Behavioral: Positive for sleep disturbance. Negative for confusion.    PMFS History:  Patient Active Problem  List   Diagnosis Date Noted  . Psoriasis 03/14/2020  . Pregnancy 01/27/2017  . Advanced maternal age in multigravida 07/04/2016  . Paronychia of finger 11/09/2015  . OTHER SEBORRHEIC DERMATITIS 05/01/2010  . Palpitations 05/01/2010  . CERVICAL LYMPHADENOPATHY 12/01/2008  . PERSONAL HISTORY CONTACT WITH & EXPOSURE TO LEAD 12/01/2008  . GERD 02/04/2008  . SINUSITIS- ACUTE-NOS 08/05/2007  . ANXIETY STATE NOS 03/16/2007  . NAUSEA, CHRONIC 03/16/2007  . NEUTROPENIA NOS 11/18/2006  . RAYNAUD'S SYNDROME 11/18/2006  . CELIAC SPRUE 11/18/2006  . OSTEOPENIA 11/18/2006  . ANA POSITIVE 11/18/2006    Past Medical History:  Diagnosis Date  . Celiac sprue   . Connective tissue disease ([place])   . Diabetes mellitus without complication ([place])   . Gestational diabetes   . Heart murmur   . Osteopenia   . Psoriasis    per patient   . Rosacea    per patient     Family History  Problem Relation Age of Onset  . Heart disease Mother   . Hyperlipidemia Mother   . Hypertension Mother   . Sarcoidosis Mother   . Asthma Brother   . Hyperlipidemia Father   . Hypertension Father   . Healthy Daughter    Past Surgical History:  Procedure Laterality Date  . BARTHOLIN GLAND CYST EXCISION    . CESAREAN SECTION  2010  . CESAREAN SECTION N/A 01/27/2017   Procedure: REPEAT CESAREAN SECTION;  Surgeon: Olga Millers, MD;  Location: [place];  Service: Obstetrics;  Laterality: N/A;  . DENTAL SURGERY  2021  . KNEE ARTHROSCOPY    . [place] EXTRACTION     Social History  Social History Narrative  . Not on file   Immunization History  Administered Date(s) Administered  . Influenza Whole 05/01/2010  . Influenza,inj,Quad PF,6+ Mos 06/04/2018  . Moderna SARS-COVID-2 Vaccination 08/03/2019, 09/01/2019  . PPD Test 10/27/2012  . Td 05/01/2010     Objective: Vital Signs: BP 123/81 (BP Location: Left Arm, Patient Position: Sitting, Cuff Size: Normal)   Pulse 87   Resp 12   Ht 5' 3"   (1.6 m)   Wt 113 lb (51.3 kg)   BMI 20.02 kg/m    Physical Exam Vitals and nursing note reviewed.  Constitutional:      Appearance: She is well-developed.  HENT:     Head: Normocephalic and atraumatic.  Eyes:     Conjunctiva/sclera: Conjunctivae normal.  Cardiovascular:     Rate and Rhythm: Normal rate and regular rhythm.     Heart sounds: Normal heart sounds.  Pulmonary:     Effort: Pulmonary effort is normal.     Breath sounds: Normal breath sounds.  Abdominal:     General: Bowel sounds are normal.     Palpations: Abdomen is soft.  Musculoskeletal:     Cervical back: Normal range of motion.  Lymphadenopathy:     Cervical: No cervical adenopathy.  Skin:    General: Skin is warm and dry.     Capillary Refill: Capillary refill takes less than 2 seconds.  Neurological:     Mental Status: She is alert and oriented to person, place, and time.  Psychiatric:        Behavior: Behavior normal.      Musculoskeletal Exam: C-spine thoracic and lumbar spine with good range of motion.  Shoulder joints, elbow joints, wrist joints, MCPs PIPs and DIPs with good range of motion with no synovitis.  Hip joints, knee joints, ankles, MTPs and PIPs with good range of motion with no synovitis.  CDAI Exam: CDAI Score: -- Patient Global: --; Provider Global: -- Swollen: --; Tender: -- Joint Exam 04/13/2020   No joint exam has been documented for this visit   There is currently no information documented on the homunculus. Go to the Rheumatology activity and complete the homunculus joint exam.  Investigation: No additional findings.  Imaging: XR Foot 2 Views Left  Result Date: 03/14/2020 No MTP, PIP or DIP narrowing was noted. No intertarsal or tibiotalar joint space narrowing was noted. No erosive changes were noted. Impression: Unremarkable x-ray of the foot.  XR Foot 2 Views Right  Result Date: 03/14/2020 No MTP, PIP or DIP narrowing was noted. No intertarsal or tibiotalar joint  space narrowing was noted. No erosive changes were noted. Impression: Unremarkable x-ray of the foot.  XR Hand 2 View Left  Result Date: 03/14/2020 No MCP, PIP or DIP narrowing was noted. No intercarpal or radiocarpal joint space narrowing was noted. No erosive changes were noted. Impression: Unremarkable x-ray of the hand.  XR Hand 2 View Right  Result Date: 03/14/2020 No MCP, PIP or DIP narrowing was noted. No intercarpal or radiocarpal joint space narrowing was noted. No erosive changes were noted. Impression: Unremarkable x-ray of the hand.   Recent Labs: Lab Results  Component Value Date   WBC 4.0 06/12/2018   HGB 14.0 06/12/2018   PLT 154.0 06/12/2018   NA 137 06/12/2018   K 4.6 06/12/2018   CL 100 06/12/2018   CO2 30 06/12/2018   GLUCOSE 85 06/12/2018   BUN 12 06/12/2018   CREATININE 0.85 06/12/2018   BILITOT 0.5 06/12/2018   ALKPHOS  111 06/12/2018   AST 26 06/12/2018   ALT 19 06/12/2018   PROT 7.0 06/12/2018   ALBUMIN 4.6 06/12/2018   CALCIUM 9.1 06/12/2018   GFRAA  04/14/2009    >60        The eGFR has been calculated using the MDRD equation. This calculation has not been validated in all clinical situations. eGFR's persistently <60 mL/min signify possible Chronic Kidney Disease.   Dec 10, 2019 ANA positive, RNP positive, dsDNA positive, (Smith SCL 70, SSA, SSB, Jo 1, anticentromere, antichromatin negative) anticardiolipin negative, sed rate 2, C3-C4 normal .   Speciality Comments: No specialty comments available.  Procedures:  No procedures performed Allergies: Clarithromycin, Gluten meal, and Wheat bran   Assessment / Plan:     Visit Diagnoses: Other systemic lupus erythematosus with other organ involvement (HCC) - Positive ANA, double-stranded DNA, RNP, low C4, Raynaud's, arthralgia, photosensitivity, hair loss, fatigue.PLQx 56mhs, 5 yrs ago by Dr. HJanae Bridgeman no benefit.  Patient symptoms are very mild.  We had detailed discussion regarding treatment  options.  She has tried Plaquenil in the past without any good response.  We discussed starting on low-dose Plaquenil and see how she tolerates it and if she notices any improvement in her symptoms.  Patient was counseled on the purpose, proper use, and adverse effects of hydroxychloroquine including nausea/diarrhea, skin rash, headaches, and sun sensitivity.  Discussed importance of annual eye exams while on hydroxychloroquine to monitor to ocular toxicity and discussed importance of frequent laboratory monitoring.  Provided patient with eye exam form for baseline ophthalmologic exam.  Provided patient with educational materials on hydroxychloroquine and answered all questions.  Patient consented to hydroxychloroquine.  Will upload consent in the media tab.  She has been advised to get baseline eye examination and then on yearly basis.  We will check labs in 1 month, 3 months and then every 5 months.  Dose will be Plaquenil 200 mg once daily.  Prescription pending lab results.   High risk medication use-the plan is to start her on Plaquenil 200 mg p.o. daily.  She would need baseline eye examination and then eye examination on a yearly basis.  Other neutropenia (HCC)-she had neutropenia in the past.  Her last WBC count was normal.  Raynaud's syndrome without gangrene-her symptoms get worse during the wintertime.  Other medical problems are listed as follows:  Celiac sprue - Diagnosed at age 44  She is on gluten-free diet.  She has had endoscopy x2.  Psoriasis - psoriasis patches on her elbows and her face.  She has been followed by dermatologist.  History of rosacea  Other seborrheic dermatitis  Paronychia of finger of left hand - Recurrent paronychia.  She was advised to avoid manicure.  Family history of sarcoidosis  Educated about COVID-19 virus infection-handout was placed in the AVS.  Orders: Orders Placed This Encounter  Procedures  . CBC with Differential/Platelet  .  COMPLETE METABOLIC PANEL WITH GFR  . Urinalysis, Routine w reflex microscopic  . Rheumatoid factor  . Cyclic citrul peptide antibody, IgG  . ANA  . Beta-2 glycoprotein antibodies  . Lupus Anticoagulant Eval w/Reflex  . Glucose 6 phosphate dehydrogenase   No orders of the defined types were placed in this encounter.    Follow-Up Instructions: Return in about 3 months (around 07/13/2020) for Systemic lupus.   SBo Merino MD  Note - This record has been created using DEditor, commissioning  Chart creation errors have been sought, but may not always  have  been located. Such creation errors do not reflect on  the standard of medical care.

## 2020-04-13 ENCOUNTER — Other Ambulatory Visit: Payer: Self-pay

## 2020-04-13 ENCOUNTER — Ambulatory Visit (INDEPENDENT_AMBULATORY_CARE_PROVIDER_SITE_OTHER): Payer: BLUE CROSS/BLUE SHIELD | Admitting: Rheumatology

## 2020-04-13 ENCOUNTER — Encounter: Payer: Self-pay | Admitting: Rheumatology

## 2020-04-13 VITALS — BP 123/81 | HR 87 | Resp 12 | Ht 63.0 in | Wt 113.0 lb

## 2020-04-13 DIAGNOSIS — K900 Celiac disease: Secondary | ICD-10-CM

## 2020-04-13 DIAGNOSIS — M3219 Other organ or system involvement in systemic lupus erythematosus: Secondary | ICD-10-CM

## 2020-04-13 DIAGNOSIS — I7300 Raynaud's syndrome without gangrene: Secondary | ICD-10-CM | POA: Diagnosis not present

## 2020-04-13 DIAGNOSIS — L409 Psoriasis, unspecified: Secondary | ICD-10-CM

## 2020-04-13 DIAGNOSIS — L218 Other seborrheic dermatitis: Secondary | ICD-10-CM

## 2020-04-13 DIAGNOSIS — Z872 Personal history of diseases of the skin and subcutaneous tissue: Secondary | ICD-10-CM

## 2020-04-13 DIAGNOSIS — Z832 Family history of diseases of the blood and blood-forming organs and certain disorders involving the immune mechanism: Secondary | ICD-10-CM

## 2020-04-13 DIAGNOSIS — D708 Other neutropenia: Secondary | ICD-10-CM

## 2020-04-13 DIAGNOSIS — Z79899 Other long term (current) drug therapy: Secondary | ICD-10-CM

## 2020-04-13 DIAGNOSIS — L03012 Cellulitis of left finger: Secondary | ICD-10-CM

## 2020-04-13 DIAGNOSIS — Z7189 Other specified counseling: Secondary | ICD-10-CM

## 2020-04-13 NOTE — Patient Instructions (Addendum)
Hydroxychloroquine tablets What is this medicine? HYDROXYCHLOROQUINE (hye drox ee KLOR oh kwin) is used to treat rheumatoid arthritis and systemic lupus erythematosus. It is also used to treat malaria. This medicine may be used for other purposes; ask your health care provider or pharmacist if you have questions. COMMON BRAND NAME(S): Plaquenil, Quineprox What should I tell my health care provider before I take this medicine? They need to know if you have any of these conditions:  diabetes  eye disease, vision problems  G6PD deficiency  heart disease  history of irregular heartbeat  if you often drink alcohol  kidney disease   liver disease  porphyria  psoriasis  an unusual or allergic reaction to chloroquine, hydroxychloroquine, other medicines, foods, dyes, or preservatives  pregnant or trying to get pregnant  breast-feeding How should I use this medicine? Take this medicine by mouth with a glass of water. Follow the directions on the prescription label. Do not cut, crush or chew this medicine. Swallow the tablets whole. Take this medicine with food. Avoid taking antacids within 4 hours of taking this medicine. It is best to separate these medicines by at least 4 hours. Take your medicine at regular intervals. Do not take it more often than directed. Take all of your medicine as directed even if you think you are better. Do not skip doses or stop your medicine early. Talk to your pediatrician regarding the use of this medicine in children. While this drug may be prescribed for selected conditions, precautions do apply. Overdosage: If you think you have taken too much of this medicine contact a poison control center or emergency room at once. NOTE: This medicine is only for you. Do not share this medicine with others. What if I miss a dose? If you miss a dose, take it as soon as you can. If it is almost time for your next dose, take only that dose. Do not take double or extra  doses. What may interact with this medicine? Do not take this medicine with any of the following medications:  cisapride  dronedarone  pimozide  thioridazine This medicine may also interact with the following medications:  ampicillin  antacids  cimetidine  cyclosporine  digoxin  kaolin  medicines for diabetes, like insulin, glipizide, glyburide  medicines for seizures like carbamazepine, phenobarbital, phenytoin  mefloquine  methotrexate  other medicines that prolong the QT interval (cause an abnormal heart rhythm)  praziquantel This list may not describe all possible interactions. Give your health care provider a list of all the medicines, herbs, non-prescription drugs, or dietary supplements you use. Also tell them if you smoke, drink alcohol, or use illegal drugs. Some items may interact with your medicine. What should I watch for while using this medicine? Visit your health care professional for regular checks on your progress. Tell your health care professional if your symptoms do not start to get better or if they get worse. You may need blood work done while you are taking this medicine. If you take other medicines that can affect heart rhythm, you may need more testing. Talk to your health care professional if you have questions. Your vision may be tested before and during use of this medicine. Tell your health care professional right away if you have any change in your eyesight. What side effects may I notice from receiving this medicine? Side effects that you should report to your doctor or health care professional as soon as possible:  allergic reactions like skin rash, itching or  hives, swelling of the face, lips, or tongue  changes in vision  decreased hearing or ringing of the ears  muscle weakness  redness, blistering, peeling or loosening of the skin, including inside the mouth  sensitivity to light  signs and symptoms of a dangerous change in  heartbeat or heart rhythm like chest pain; dizziness; fast or irregular heartbeat; palpitations; feeling faint or lightheaded, falls; breathing problems  signs and symptoms of liver injury like dark yellow or brown urine; general ill feeling or flu-like symptoms; light-colored stools; loss of appetite; nausea; right upper belly pain; unusually weak or tired; yellowing of the eyes or skin  signs and symptoms of low blood sugar such as feeling anxious; confusion; dizziness; increased hunger; unusually weak or tired; sweating; shakiness; cold; irritable; headache; blurred vision; fast heartbeat; loss of consciousness  suicidal thoughts  uncontrollable head, mouth, neck, arm, or leg movements Side effects that usually do not require medical attention (report to your doctor or health care professional if they continue or are bothersome):  diarrhea  dizziness  hair loss  headache  irritable  loss of appetite  nausea, vomiting  stomach pain This list may not describe all possible side effects. Call your doctor for medical advice about side effects. You may report side effects to FDA at 1-800-FDA-1088. Where should I keep my medicine? Keep out of the reach of children. Store at room temperature between 15 and 30 degrees C (59 and 86 degrees F). Protect from moisture and light. Throw away any unused medicine after the expiration date. NOTE: This sheet is a summary. It may not cover all possible information. If you have questions about this medicine, talk to your doctor, pharmacist, or health care provider.  2020 Elsevier/Gold Standard (2018-11-23 12:56:32)  Standing Labs We placed an order today for your standing lab work.   Please have your standing labs drawn in 1 month, 3 months and then every 5 months  If possible, please have your labs drawn 2 weeks prior to your appointment so that the provider can discuss your results at your appointment.  We have open lab daily Monday through  Thursday from 8:30-12:30 PM and 1:30-4:30 PM and Friday from 8:30-12:30 PM and 1:30-4:00 PM at the office of Dr. Bo Merino, [facility].   Please be advised, patients with office appointments requiring lab work will take precedents over walk-in lab work.  If possible, please come for your lab work on Monday and Friday afternoons, as you may experience shorter wait times. The office is located at [address], [place], [place], [place] [address] No appointment is necessary.   Labs are drawn by [facility]. Please bring your co-pay at the time of your lab draw.  You may receive a bill from [facility] for your lab work.  If you wish to have your labs drawn at another location, please call the office 24 hours in advance to send orders.  If you have any questions regarding directions or hours of operation,  please call [phone].   As a reminder, please drink plenty of water prior to coming for your lab work. Thanks!  COVID-19 vaccine recommendations:   COVID-19 vaccine is recommended for everyone (unless you are allergic to a vaccine component), even if you are on a medication that suppresses your immune system.   If you are on Methotrexate, Cellcept (mycophenolate), Rinvoq, Morrie Sheldon, and Olumiant- hold the medication for 1 week after each vaccine. Hold Methotrexate for 2 weeks after the single dose COVID-19 vaccine.  If you are on Orencia subcutaneous injection - hold medication one week prior to and one week after the first COVID-19 vaccine dose (only).   If you are on Orencia IV infusions- time vaccination administration so that the first COVID-19 vaccination will occur four weeks after the infusion and postpone the subsequent infusion by one week.   If you are on Cyclophosphamide or Rituxan infusions please contact your doctor prior to receiving the COVID-19 vaccine.   Do not take Tylenol or any anti-inflammatory medications (NSAIDs) 24 hours prior to the COVID-19  vaccination.   There is no direct evidence about the efficacy of the COVID-19 vaccine in individuals who are on medications that suppress the immune system.   Even if you are fully vaccinated, and you are on any medications that suppress your immune system, please continue to wear a mask, maintain at least six feet social distance and practice hand hygiene.   If you develop a COVID-19 infection, please contact your PCP or our office to determine if you need antibody infusion.  The booster vaccine is now available for immunocompromised patients. It is advised that if you had Pfizer vaccine you should get [employer] booster.  If you had a Moderna vaccine then you should get a Moderna booster. Johnson and Wynetta Emery does not have a booster vaccine at this time.  Please see the following web sites for updated information.   https://[email or web address]/[email or web address]/0/Files/[email or web address]-Vaccination-Patient-Resources.[email or web address]  https://[email or web address]/About-Us/Newsroom/Press-Releases/ID/1159

## 2020-04-13 NOTE — Telephone Encounter (Signed)
Pending lab results, patient will be starting plaquenil per Dr. Estanislado Pandy. Thanks!   Consent obtained and sent to the scan center.

## 2020-04-19 ENCOUNTER — Encounter: Payer: Self-pay | Admitting: Rheumatology

## 2020-04-19 LAB — COMPLETE METABOLIC PANEL WITH GFR
AG Ratio: 1.7 (calc) (ref 1.0–2.5)
ALT: 12 U/L (ref 6–29)
AST: 20 U/L (ref 10–30)
Albumin: 4.4 g/dL (ref 3.6–5.1)
Alkaline phosphatase (APISO): 46 U/L (ref 31–125)
BUN: 14 mg/dL (ref 7–25)
CO2: 24 mmol/L (ref 20–32)
Calcium: 9.4 mg/dL (ref 8.6–10.2)
Chloride: 104 mmol/L (ref 98–110)
Creat: 0.84 mg/dL (ref 0.50–1.10)
GFR, Est African American: 98 mL/min/{1.73_m2} (ref >=60)
GFR, Est Non African American: 85 mL/min/{1.73_m2} (ref >=60)
Globulin: 2.6 g/dL (calc) (ref 1.9–3.7)
Glucose, Bld: 79 mg/dL (ref 65–99)
Potassium: 4.4 mmol/L (ref 3.5–5.3)
Sodium: 139 mmol/L (ref 135–146)
Total Bilirubin: 0.5 mg/dL (ref 0.2–1.2)
Total Protein: 7 g/dL (ref 6.1–8.1)

## 2020-04-19 LAB — CBC WITH DIFFERENTIAL/PLATELET
Absolute Monocytes: 526 cells/uL (ref 200–950)
Basophils Absolute: 28 cells/uL (ref 0–200)
Basophils Relative: 0.6 %
Eosinophils Absolute: 71 cells/uL (ref 15–500)
Eosinophils Relative: 1.5 %
HCT: 43.3 % (ref 35.0–45.0)
Hemoglobin: 14.3 g/dL (ref 11.7–15.5)
Lymphs Abs: 1828 cells/uL (ref 850–3900)
MCH: 31 pg (ref 27.0–33.0)
MCHC: 33 g/dL (ref 32.0–36.0)
MCV: 93.9 fL (ref 80.0–100.0)
MPV: 11.2 fL (ref 7.5–12.5)
Monocytes Relative: 11.2 %
Neutro Abs: 2247 cells/uL (ref 1500–7800)
Neutrophils Relative %: 47.8 %
Platelets: 198 10*3/uL (ref 140–400)
RBC: 4.61 10*6/uL (ref 3.80–5.10)
RDW: 12.1 % (ref 11.0–15.0)
Total Lymphocyte: 38.9 %
WBC: 4.7 10*3/uL (ref 3.8–10.8)

## 2020-04-19 LAB — TEST AUTHORIZATION

## 2020-04-19 LAB — LUPUS ANTICOAGULANT EVAL W/ REFLEX
PTT-LA Screen: 38 s (ref <=40)
dRVVT: 33 s (ref <=45)

## 2020-04-19 LAB — URINALYSIS, ROUTINE W REFLEX MICROSCOPIC
Bilirubin Urine: NEGATIVE
Glucose, UA: NEGATIVE
Hgb urine dipstick: NEGATIVE
Ketones, ur: NEGATIVE
Leukocytes,Ua: NEGATIVE
Nitrite: NEGATIVE
Protein, ur: NEGATIVE
Specific Gravity, Urine: 1.006 (ref 1.001–1.03)
pH: 6 (ref 5.0–8.0)

## 2020-04-19 LAB — BETA-2 GLYCOPROTEIN ANTIBODIES
Beta-2 Glyco 1 IgA: 5.4 U/mL
Beta-2 Glyco 1 IgM: 3.6 U/mL
Beta-2 Glyco I IgG: <2 U/mL

## 2020-04-19 LAB — ANA: Anti Nuclear Antibody (ANA): POSITIVE — AB

## 2020-04-19 LAB — GLUCOSE 6 PHOSPHATE DEHYDROGENASE: G-6PDH: 14 U/g Hgb (ref 7.0–20.5)

## 2020-04-19 LAB — ANTI-NUCLEAR AB-TITER (ANA TITER)
ANA TITER: 1:320 {titer} — ABNORMAL HIGH
ANA Titer 1: 1:80 {titer} — ABNORMAL HIGH

## 2020-04-19 LAB — RHEUMATOID FACTOR: Rheumatoid fact SerPl-aCnc: <14 IU/mL (ref <14)

## 2020-04-19 LAB — ANTI-DNA ANTIBODY, DOUBLE-STRANDED: ds DNA Ab: 11 IU/mL — ABNORMAL HIGH

## 2020-04-19 LAB — CYCLIC CITRUL PEPTIDE ANTIBODY, IGG: Cyclic Citrullin Peptide Ab: <16 UNITS

## 2020-04-19 MED ORDER — HYDROXYCHLOROQUINE SULFATE 200 MG PO TABS: 200.0000 mg | ORAL_TABLET | Freq: Every day | ORAL | 2 refills | Status: DC

## 2020-04-19 NOTE — Telephone Encounter (Signed)
Per office note on 04/13/2020: -the plan is to start her on Plaquenil 200 mg p.o. daily

## 2020-04-19 NOTE — Telephone Encounter (Signed)
I had a detailed discussion with the patient.  I discussed the ANA pattern which is homogeneous and speckled.  I discussed with her that it is nonspecific pattern although double-stranded DNA is specific for systemic lupus.  She has not picked Plaquenil prescription yet.  She stated that she will pick up the Plaquenil prescription and will start the medication.  She will also schedule her eye examination.

## 2020-04-19 NOTE — Progress Notes (Signed)
Please advise patient that ANA and double-stranded DNA are positive.  Please ask if she can come in for sed rate, C3 and C4.

## 2020-04-19 NOTE — Progress Notes (Signed)
ANA is positive, all other labs are negative.  Continue Plaquenil as planned.  Please add double-stranded DNA, C3 and C4 if possible.

## 2020-04-26 ENCOUNTER — Encounter: Payer: Self-pay | Admitting: Rheumatology

## 2020-05-03 ENCOUNTER — Other Ambulatory Visit: Payer: Self-pay | Admitting: *Deleted

## 2020-05-03 DIAGNOSIS — M3219 Other organ or system involvement in systemic lupus erythematosus: Secondary | ICD-10-CM

## 2020-05-04 LAB — C3 AND C4
C3 Complement: 74 mg/dL — ABNORMAL LOW (ref 83–193)
C4 Complement: 19 mg/dL (ref 15–57)

## 2020-05-04 LAB — SEDIMENTATION RATE: Sed Rate: 2 mm/h (ref 0–20)

## 2020-05-04 NOTE — Progress Notes (Signed)
I will discuss results at the follow-up visit.

## 2020-05-05 NOTE — Progress Notes (Deleted)
Office Visit Note  Patient: Madeline Flores             Date of Birth: [birth year]           MRN: [id number]             PCP: Scheryl Marten, PA Referring: Scheryl Marten, [place] Visit Date: 05/16/2020 Occupation: @GUAROCC @  Subjective:  No chief complaint on file.   History of Present Illness: Madeline Flores is a 44 y.o. female ***   Activities of Daily Living:  Patient reports morning stiffness for *** {minute/hour:19697}.   Patient {ACTIONS;DENIES/REPORTS:21021675::"Denies"} nocturnal pain.  Difficulty dressing/grooming: {ACTIONS;DENIES/REPORTS:21021675::"Denies"} Difficulty climbing stairs: {ACTIONS;DENIES/REPORTS:21021675::"Denies"} Difficulty getting out of chair: {ACTIONS;DENIES/REPORTS:21021675::"Denies"} Difficulty using hands for taps, buttons, cutlery, and/or writing: {ACTIONS;DENIES/REPORTS:21021675::"Denies"}  No Rheumatology ROS completed.   PMFS History:  Patient Active Problem List   Diagnosis Date Noted  . Psoriasis 03/14/2020  . Pregnancy 01/27/2017  . Advanced maternal age in multigravida 07/04/2016  . Paronychia of finger 11/09/2015  . OTHER SEBORRHEIC DERMATITIS 05/01/2010  . Palpitations 05/01/2010  . CERVICAL LYMPHADENOPATHY 12/01/2008  . PERSONAL HISTORY CONTACT WITH & EXPOSURE TO LEAD 12/01/2008  . GERD 02/04/2008  . SINUSITIS- ACUTE-NOS 08/05/2007  . ANXIETY STATE NOS 03/16/2007  . NAUSEA, CHRONIC 03/16/2007  . NEUTROPENIA NOS 11/18/2006  . RAYNAUD'S SYNDROME 11/18/2006  . CELIAC SPRUE 11/18/2006  . OSTEOPENIA 11/18/2006  . ANA POSITIVE 11/18/2006    Past Medical History:  Diagnosis Date  . Celiac sprue   . Connective tissue disease ([place])   . Diabetes mellitus without complication ([place])   . Gestational diabetes   . Heart murmur   . Osteopenia   . Psoriasis    per patient   . Rosacea    per patient     Family History  Problem Relation Age of Onset  . Heart disease Mother   . Hyperlipidemia Mother   . Hypertension Mother   .  Sarcoidosis Mother   . Asthma Brother   . Hyperlipidemia Father   . Hypertension Father   . Healthy Daughter    Past Surgical History:  Procedure Laterality Date  . BARTHOLIN GLAND CYST EXCISION    . CESAREAN SECTION  2010  . CESAREAN SECTION N/A 01/27/2017   Procedure: REPEAT CESAREAN SECTION;  Surgeon: Olga Millers, MD;  Location: [facility];  Service: Obstetrics;  Laterality: N/A;  . DENTAL SURGERY  2021  . KNEE ARTHROSCOPY    . WISDOM TOOTH EXTRACTION     Social History   Social History Narrative  . Not on file   Immunization History  Administered Date(s) Administered  . Influenza Whole 05/01/2010  . Influenza,inj,Quad PF,6+ Mos 06/04/2018  . Moderna SARS-COVID-2 Vaccination 08/03/2019, 09/01/2019  . PPD Test 10/27/2012  . Td 05/01/2010     Objective: Vital Signs: There were no vitals taken for this visit.   Physical Exam   Musculoskeletal Exam: ***  CDAI Exam: CDAI Score: -- Patient Global: --; Provider Global: -- Swollen: --; Tender: -- Joint Exam 05/16/2020   No joint exam has been documented for this visit   There is currently no information documented on the homunculus. Go to the Rheumatology activity and complete the homunculus joint exam.  Investigation: No additional findings.  Imaging: No results found.  Recent Labs: Lab Results  Component Value Date   WBC 4.7 04/13/2020   HGB 14.3 04/13/2020   PLT 198 04/13/2020   NA 139 04/13/2020   K 4.4 04/13/2020   CL 104  04/13/2020   CO2 24 04/13/2020   GLUCOSE 79 04/13/2020   BUN 14 04/13/2020   CREATININE 0.84 04/13/2020   BILITOT 0.5 04/13/2020   ALKPHOS 111 06/12/2018   AST 20 04/13/2020   ALT 12 04/13/2020   PROT 7.0 04/13/2020   ALBUMIN 4.6 06/12/2018   CALCIUM 9.4 04/13/2020   GFRAA 98 04/13/2020    Speciality Comments: No specialty comments available.  Procedures:  No procedures performed Allergies: Clarithromycin, Gluten meal, and Wheat bran   Assessment / Plan:      Visit Diagnoses: Other systemic lupus erythematosus with other organ involvement ([place])  High risk medication use  Other neutropenia (HCC)  Raynaud's syndrome without gangrene  Celiac sprue  Psoriasis  History of rosacea  Other seborrheic dermatitis  Family history of sarcoidosis  Orders: No orders of the defined types were placed in this encounter.  No orders of the defined types were placed in this encounter.   Face-to-face time spent with patient was *** minutes. Greater than 50% of time was spent in counseling and coordination of care.  Follow-Up Instructions: No follow-ups on file.   Ofilia Neas, PA-C  Note - This record has been created using Dragon software.  Chart creation errors have been sought, but may not always  have been located. Such creation errors do not reflect on  the standard of medical care.

## 2020-05-16 ENCOUNTER — Ambulatory Visit: Payer: BLUE CROSS/BLUE SHIELD | Admitting: Rheumatology

## 2020-05-16 DIAGNOSIS — D708 Other neutropenia: Secondary | ICD-10-CM

## 2020-05-16 DIAGNOSIS — L409 Psoriasis, unspecified: Secondary | ICD-10-CM

## 2020-05-16 DIAGNOSIS — I7300 Raynaud's syndrome without gangrene: Secondary | ICD-10-CM

## 2020-05-16 DIAGNOSIS — Z79899 Other long term (current) drug therapy: Secondary | ICD-10-CM

## 2020-05-16 DIAGNOSIS — Z832 Family history of diseases of the blood and blood-forming organs and certain disorders involving the immune mechanism: Secondary | ICD-10-CM

## 2020-05-16 DIAGNOSIS — K900 Celiac disease: Secondary | ICD-10-CM

## 2020-05-16 DIAGNOSIS — L218 Other seborrheic dermatitis: Secondary | ICD-10-CM

## 2020-05-16 DIAGNOSIS — M3219 Other organ or system involvement in systemic lupus erythematosus: Secondary | ICD-10-CM

## 2020-05-16 DIAGNOSIS — Z872 Personal history of diseases of the skin and subcutaneous tissue: Secondary | ICD-10-CM

## 2020-05-19 NOTE — Progress Notes (Signed)
Office Visit Note  Patient: Madeline Flores             Date of Birth: 01-21-1976           MRN: [id number]             PCP: Scheryl Marten, PA Referring: Scheryl Marten, [place] Visit Date: 05/29/2020 Occupation: @GUAROCC @  Subjective:  Medication Management (Discuss PLQ and plan moving forward)   History of Present Illness: Madeline Flores is a 44 y.o. female with history of systemic lupus dermatosis.  She states she decided not to start Plaquenil as she will be undergoing surgery for left ring finger toenail infection.  She took only 2 doses of Plaquenil.  She states she continues to have a lot of fatigue.  She also has some joint pain and photosensitivity.  She has Raynaud's during the wintertime.  She states that she saw the dermatologist for the rash on her right finger which was supposed to be a birthmark per patient.  Activities of Daily Living:  Patient reports morning stiffness for 0 minutes.   Patient Denies nocturnal pain.  Difficulty dressing/grooming: Denies Difficulty climbing stairs: Denies Difficulty getting out of chair: Denies Difficulty using hands for taps, buttons, cutlery, and/or writing: Denies  Review of Systems  Constitutional: Positive for fatigue.  HENT: Negative for mouth sores, mouth dryness and nose dryness.   Eyes: Negative for pain, itching, visual disturbance and dryness.  Respiratory: Negative for cough, hemoptysis, shortness of breath and difficulty breathing.   Cardiovascular: Negative for chest pain, palpitations and swelling in legs/feet.  Gastrointestinal: Negative for abdominal pain, blood in stool, constipation and diarrhea.  Endocrine: Negative for increased urination.  Genitourinary: Negative for painful urination.  Musculoskeletal: Positive for myalgias, muscle weakness, muscle tenderness and myalgias. Negative for arthralgias, joint pain, joint swelling and morning stiffness.  Skin: Positive for color change and rash. Negative for  redness.  Allergic/Immunologic: Negative for susceptible to infections.  Neurological: Negative for dizziness, numbness, headaches, memory loss and weakness.  Hematological: Negative for swollen glands.  Psychiatric/Behavioral: Positive for sleep disturbance. Negative for confusion.    PMFS History:  Patient Active Problem List   Diagnosis Date Noted  . Psoriasis 03/14/2020  . Pregnancy 01/27/2017  . Advanced maternal age in multigravida 07/04/2016  . Paronychia of finger 11/09/2015  . OTHER SEBORRHEIC DERMATITIS 05/01/2010  . Palpitations 05/01/2010  . CERVICAL LYMPHADENOPATHY 12/01/2008  . PERSONAL HISTORY CONTACT WITH & EXPOSURE TO LEAD 12/01/2008  . GERD 02/04/2008  . SINUSITIS- ACUTE-NOS 08/05/2007  . ANXIETY STATE NOS 03/16/2007  . NAUSEA, CHRONIC 03/16/2007  . NEUTROPENIA NOS 11/18/2006  . RAYNAUD'S SYNDROME 11/18/2006  . CELIAC SPRUE 11/18/2006  . OSTEOPENIA 11/18/2006  . ANA POSITIVE 11/18/2006    Past Medical History:  Diagnosis Date  . Celiac sprue   . Connective tissue disease ([place])   . Diabetes mellitus without complication ([place])   . Gestational diabetes   . Heart murmur   . Osteopenia   . Psoriasis    per patient   . Rosacea    per patient     Family History  Problem Relation Age of Onset  . Heart disease Mother   . Hyperlipidemia Mother   . Hypertension Mother   . Sarcoidosis Mother   . Asthma Brother   . Hyperlipidemia Father   . Hypertension Father   . Healthy Daughter    Past Surgical History:  Procedure Laterality Date  . BARTHOLIN GLAND CYST EXCISION    .  CESAREAN SECTION  2010  . CESAREAN SECTION N/A 01/27/2017   Procedure: REPEAT CESAREAN SECTION;  Surgeon: Olga Millers, MD;  Location: [facility];  Service: Obstetrics;  Laterality: N/A;  . DENTAL SURGERY  2021  . KNEE ARTHROSCOPY    . WISDOM TOOTH EXTRACTION     Social History   Social History Narrative  . Not on file   Immunization History  Administered Date(s)  Administered  . Influenza Whole 05/01/2010  . Influenza,inj,Quad PF,6+ Mos 06/04/2018  . Moderna SARS-COVID-2 Vaccination 08/03/2019, 09/01/2019  . PPD Test 10/27/2012  . Td 05/01/2010     Objective: Vital Signs: BP 104/71 (BP Location: Left Arm, Patient Position: Sitting, Cuff Size: Small)   Pulse 79   Ht 5' 3.5" (1.613 m)   Wt 112 lb 12.8 oz (51.2 kg)   BMI 19.67 kg/m    Physical Exam Vitals and nursing note reviewed.  Constitutional:      Appearance: She is well-developed.  HENT:     Head: Normocephalic and atraumatic.  Eyes:     Conjunctiva/sclera: Conjunctivae normal.  Cardiovascular:     Rate and Rhythm: Normal rate and regular rhythm.     Heart sounds: Normal heart sounds.  Pulmonary:     Effort: Pulmonary effort is normal.     Breath sounds: Normal breath sounds.  Abdominal:     General: Bowel sounds are normal.     Palpations: Abdomen is soft.  Musculoskeletal:     Cervical back: Normal range of motion.  Lymphadenopathy:     Cervical: No cervical adenopathy.  Skin:    General: Skin is warm and dry.     Capillary Refill: Capillary refill takes less than 2 seconds.  Neurological:     Mental Status: She is alert and oriented to person, place, and time.  Psychiatric:        Behavior: Behavior normal.      Musculoskeletal Exam: C-spine thoracic and lumbar spine were in good range of motion.  Shoulder joints, elbow joints, wrist joints, MCPs PIPs and DIPs with good range of motion with no synovitis.  Hip joints, knee joints, ankles, MTPs and PIPs with good range of motion with no synovitis.  CDAI Exam: CDAI Score: -- Patient Global: --; Provider Global: -- Swollen: --; Tender: -- Joint Exam 05/29/2020   No joint exam has been documented for this visit   There is currently no information documented on the homunculus. Go to the Rheumatology activity and complete the homunculus joint exam.  Investigation: No additional findings.  Imaging: No results  found.  Recent Labs: Lab Results  Component Value Date   WBC 4.7 04/13/2020   HGB 14.3 04/13/2020   PLT 198 04/13/2020   NA 139 04/13/2020   K 4.4 04/13/2020   CL 104 04/13/2020   CO2 24 04/13/2020   GLUCOSE 79 04/13/2020   BUN 14 04/13/2020   CREATININE 0.84 04/13/2020   BILITOT 0.5 04/13/2020   ALKPHOS 111 06/12/2018   AST 20 04/13/2020   ALT 12 04/13/2020   PROT 7.0 04/13/2020   ALBUMIN 4.6 06/12/2018   CALCIUM 9.4 04/13/2020   GFRAA 98 04/13/2020  April 13, 2020 sed rate 2, C3 74, C4 19  Speciality Comments: No specialty comments available.  Procedures:  No procedures performed Allergies: Clarithromycin, Gluten meal, and Wheat bran   Assessment / Plan:     Visit Diagnoses: Other systemic lupus erythematosus with other organ involvement (HCC) - Positive ANA, double-stranded DNA, RNP, low C3, Raynaud's,  arthralgia, photosensitivity, hair loss, fatigue.PLQx 66mhs, 5 yrs ago by Dr. HJanae Bridgeman no benefit.  She took only 2 tablets of Plaquenil and then discontinued.  She denies any history of oral ulcers or nasal ulcers.  She states she continues to have arthralgias and fatigue.  Left findings were discussed with the patient.  High risk medication use - Plaquenil 200 mg p.o. daily.  Patient plans to start the medication after her surgery.  Other neutropenia (HCC)-in the past per patient.  Raynaud's syndrome without gangrene-her symptoms are worse during wintertime.  Celiac sprue - Diagnosed at age 44  She is on gluten-free diet.  She has had endoscopy x2.  Psoriasis - She has been followed by dermatologist.  Other seborrheic dermatitis  History of rosacea  Paronychia of finger of left hand - Recurrent paronychia.  She was advised to avoid manicure.  She is a scheduled to have surgery for her left ring finger paronychia.  Family history of sarcoidosis  Orders: No orders of the defined types were placed in this encounter.  No orders of the defined types were  placed in this encounter.     Follow-Up Instructions: Return in about 3 months (around 08/29/2020) for Systemic lupus.   SBo Merino MD  Note - This record has been created using DEditor, commissioning  Chart creation errors have been sought, but may not always  have been located. Such creation errors do not reflect on  the standard of medical care.

## 2020-05-29 ENCOUNTER — Encounter: Payer: Self-pay | Admitting: Rheumatology

## 2020-05-29 ENCOUNTER — Ambulatory Visit (INDEPENDENT_AMBULATORY_CARE_PROVIDER_SITE_OTHER): Payer: BLUE CROSS/BLUE SHIELD | Admitting: Rheumatology

## 2020-05-29 ENCOUNTER — Other Ambulatory Visit: Payer: Self-pay

## 2020-05-29 VITALS — BP 104/71 | HR 79 | Ht 63.5 in | Wt 112.8 lb

## 2020-05-29 DIAGNOSIS — M3219 Other organ or system involvement in systemic lupus erythematosus: Secondary | ICD-10-CM

## 2020-05-29 DIAGNOSIS — Z872 Personal history of diseases of the skin and subcutaneous tissue: Secondary | ICD-10-CM

## 2020-05-29 DIAGNOSIS — I7300 Raynaud's syndrome without gangrene: Secondary | ICD-10-CM | POA: Diagnosis not present

## 2020-05-29 DIAGNOSIS — L218 Other seborrheic dermatitis: Secondary | ICD-10-CM

## 2020-05-29 DIAGNOSIS — K900 Celiac disease: Secondary | ICD-10-CM

## 2020-05-29 DIAGNOSIS — D708 Other neutropenia: Secondary | ICD-10-CM

## 2020-05-29 DIAGNOSIS — L409 Psoriasis, unspecified: Secondary | ICD-10-CM

## 2020-05-29 DIAGNOSIS — Z79899 Other long term (current) drug therapy: Secondary | ICD-10-CM | POA: Diagnosis not present

## 2020-05-29 DIAGNOSIS — L03012 Cellulitis of left finger: Secondary | ICD-10-CM

## 2020-05-29 DIAGNOSIS — Z832 Family history of diseases of the blood and blood-forming organs and certain disorders involving the immune mechanism: Secondary | ICD-10-CM

## 2020-07-14 ENCOUNTER — Telehealth: Payer: Self-pay | Admitting: Rheumatology

## 2020-07-14 ENCOUNTER — Ambulatory Visit: Payer: BLUE CROSS/BLUE SHIELD | Admitting: Rheumatology

## 2020-07-14 NOTE — Telephone Encounter (Signed)
Patient has issues with a bill for date of service 03/14/2020. Patient had $600.00 out of pocket bill that she paid, then she got a second bill for $250.00. Patient is out of network with insurance. Would like to see if a discount could be concerned due to her paying cash.

## 2020-08-18 NOTE — Progress Notes (Deleted)
Office Visit Note  Patient: Madeline Flores             Date of Birth: [birth year]           MRN: [id number]             PCP: Scheryl Marten, PA Referring: Scheryl Marten, [place] Visit Date: 09/01/2020 Occupation: @GUAROCC @  Subjective:  No chief complaint on file.   History of Present Illness: Madeline Flores is a 45 y.o. female ***   Activities of Daily Living:  Patient reports morning stiffness for *** {minute/hour:19697}.   Patient {ACTIONS;DENIES/REPORTS:21021675::"Denies"} nocturnal pain.  Difficulty dressing/grooming: {ACTIONS;DENIES/REPORTS:21021675::"Denies"} Difficulty climbing stairs: {ACTIONS;DENIES/REPORTS:21021675::"Denies"} Difficulty getting out of chair: {ACTIONS;DENIES/REPORTS:21021675::"Denies"} Difficulty using hands for taps, buttons, cutlery, and/or writing: {ACTIONS;DENIES/REPORTS:21021675::"Denies"}  No Rheumatology ROS completed.   PMFS History:  Patient Active Problem List   Diagnosis Date Noted  . Psoriasis 03/14/2020  . Pregnancy 01/27/2017  . Advanced maternal age in multigravida 07/04/2016  . Paronychia of finger 11/09/2015  . OTHER SEBORRHEIC DERMATITIS 05/01/2010  . Palpitations 05/01/2010  . CERVICAL LYMPHADENOPATHY 12/01/2008  . PERSONAL HISTORY CONTACT WITH & EXPOSURE TO LEAD 12/01/2008  . GERD 02/04/2008  . SINUSITIS- ACUTE-NOS 08/05/2007  . ANXIETY STATE NOS 03/16/2007  . NAUSEA, CHRONIC 03/16/2007  . NEUTROPENIA NOS 11/18/2006  . RAYNAUD'S SYNDROME 11/18/2006  . CELIAC SPRUE 11/18/2006  . OSTEOPENIA 11/18/2006  . ANA POSITIVE 11/18/2006    Past Medical History:  Diagnosis Date  . Celiac sprue   . Connective tissue disease ([place])   . Diabetes mellitus without complication ([place])   . Gestational diabetes   . Heart murmur   . Osteopenia   . Psoriasis    per patient   . Rosacea    per patient     Family History  Problem Relation Age of Onset  . Heart disease Mother   . Hyperlipidemia Mother   . Hypertension Mother   .  Sarcoidosis Mother   . Asthma Brother   . Hyperlipidemia Father   . Hypertension Father   . Healthy Daughter    Past Surgical History:  Procedure Laterality Date  . BARTHOLIN GLAND CYST EXCISION    . CESAREAN SECTION  2010  . CESAREAN SECTION N/A 01/27/2017   Procedure: REPEAT CESAREAN SECTION;  Surgeon: Olga Millers, MD;  Location: [facility];  Service: Obstetrics;  Laterality: N/A;  . DENTAL SURGERY  2021  . KNEE ARTHROSCOPY    . WISDOM TOOTH EXTRACTION     Social History   Social History Narrative  . Not on file   Immunization History  Administered Date(s) Administered  . Influenza Whole 05/01/2010  . Influenza,inj,Quad PF,6+ Mos 06/04/2018  . Moderna Sars-Covid-2 Vaccination 08/03/2019, 09/01/2019  . PPD Test 10/27/2012  . Td 05/01/2010     Objective: Vital Signs: There were no vitals taken for this visit.   Physical Exam   Musculoskeletal Exam: ***  CDAI Exam: CDAI Score: -- Patient Global: --; Provider Global: -- Swollen: --; Tender: -- Joint Exam 09/01/2020   No joint exam has been documented for this visit   There is currently no information documented on the homunculus. Go to the Rheumatology activity and complete the homunculus joint exam.  Investigation: No additional findings.  Imaging: No results found.  Recent Labs: Lab Results  Component Value Date   WBC 4.7 04/13/2020   HGB 14.3 04/13/2020   PLT 198 04/13/2020   NA 139 04/13/2020   K 4.4 04/13/2020   CL 104  04/13/2020   CO2 24 04/13/2020   GLUCOSE 79 04/13/2020   BUN 14 04/13/2020   CREATININE 0.84 04/13/2020   BILITOT 0.5 04/13/2020   ALKPHOS 111 06/12/2018   AST 20 04/13/2020   ALT 12 04/13/2020   PROT 7.0 04/13/2020   ALBUMIN 4.6 06/12/2018   CALCIUM 9.4 04/13/2020   GFRAA 98 04/13/2020    Speciality Comments: No specialty comments available.  Procedures:  No procedures performed Allergies: Clarithromycin, Gluten meal, and Wheat bran   Assessment / Plan:      Visit Diagnoses: No diagnosis found.  Orders: No orders of the defined types were placed in this encounter.  No orders of the defined types were placed in this encounter.   Face-to-face time spent with patient was *** minutes. Greater than 50% of time was spent in counseling and coordination of care.  Follow-Up Instructions: No follow-ups on file.   Earnestine Mealing, CMA  Note - This record has been created using Editor, commissioning.  Chart creation errors have been sought, but may not always  have been located. Such creation errors do not reflect on  the standard of medical care.

## 2020-09-01 ENCOUNTER — Ambulatory Visit: Payer: BLUE CROSS/BLUE SHIELD | Admitting: Rheumatology

## 2021-10-12 DIAGNOSIS — M329 Systemic lupus erythematosus, unspecified: Secondary | ICD-10-CM | POA: Diagnosis not present

## 2021-10-12 DIAGNOSIS — Z79899 Other long term (current) drug therapy: Secondary | ICD-10-CM | POA: Diagnosis not present

## 2022-01-02 DIAGNOSIS — L03019 Cellulitis of unspecified finger: Secondary | ICD-10-CM | POA: Diagnosis not present

## 2022-01-02 DIAGNOSIS — L409 Psoriasis, unspecified: Secondary | ICD-10-CM | POA: Diagnosis not present

## 2022-01-02 DIAGNOSIS — L658 Other specified nonscarring hair loss: Secondary | ICD-10-CM | POA: Diagnosis not present

## 2022-01-18 DIAGNOSIS — R0789 Other chest pain: Secondary | ICD-10-CM | POA: Diagnosis not present

## 2022-05-08 DIAGNOSIS — R195 Other fecal abnormalities: Secondary | ICD-10-CM | POA: Diagnosis not present

## 2022-05-08 DIAGNOSIS — R197 Diarrhea, unspecified: Secondary | ICD-10-CM | POA: Diagnosis not present

## 2022-06-06 DIAGNOSIS — Z79899 Other long term (current) drug therapy: Secondary | ICD-10-CM | POA: Diagnosis not present

## 2022-06-19 DIAGNOSIS — K900 Celiac disease: Secondary | ICD-10-CM | POA: Diagnosis not present

## 2022-06-19 DIAGNOSIS — Z1211 Encounter for screening for malignant neoplasm of colon: Secondary | ICD-10-CM | POA: Diagnosis not present

## 2022-07-02 DIAGNOSIS — R1013 Epigastric pain: Secondary | ICD-10-CM | POA: Diagnosis not present

## 2022-07-16 DIAGNOSIS — Z79899 Other long term (current) drug therapy: Secondary | ICD-10-CM | POA: Diagnosis not present

## 2022-08-01 DIAGNOSIS — R002 Palpitations: Secondary | ICD-10-CM | POA: Diagnosis not present

## 2022-08-06 ENCOUNTER — Encounter: Payer: Self-pay | Admitting: Internal Medicine

## 2022-08-06 ENCOUNTER — Telehealth: Payer: Self-pay | Admitting: Internal Medicine

## 2022-08-06 ENCOUNTER — Other Ambulatory Visit: Payer: Self-pay | Admitting: Internal Medicine

## 2022-08-06 ENCOUNTER — Ambulatory Visit: Payer: Self-pay | Attending: Cardiology | Admitting: Internal Medicine

## 2022-08-06 VITALS — BP 136/84 | HR 97 | Ht 63.5 in | Wt 109.0 lb

## 2022-08-06 DIAGNOSIS — R002 Palpitations: Secondary | ICD-10-CM

## 2022-08-06 DIAGNOSIS — I471 Supraventricular tachycardia, unspecified: Secondary | ICD-10-CM

## 2022-08-06 MED ORDER — METOPROLOL SUCCINATE ER 25 MG PO TB24: 25.0000 mg | ORAL_TABLET | Freq: Every day | ORAL | 5 refills | Status: DC

## 2022-08-06 NOTE — Patient Instructions (Addendum)
Medication Instructions:  Your physician recommends that you continue on your current medications as directed. Please refer to the Current Medication list given to you today.  *If you need a refill on your cardiac medications before your next appointment, please call your pharmacy*  Lab Work: None ordered.  If you have labs (blood work) drawn today and your tests are completely normal, you will receive your results only by: [place] (if you have MyChart) OR A paper copy in the mail If you have any lab test that is abnormal or we need to change your treatment, we will call you to review the results.  Testing/Procedures: None ordered.  Follow-Up: At [facility], you and your health needs are our priority.  As part of our continuing mission to provide you with exceptional heart care, we have created designated Provider Care Teams.  These Care Teams include your primary Cardiologist (physician) and Advanced Practice Providers (APPs -  Physician Assistants and Nurse Practitioners) who all work together to provide you with the care you need, when you need it.  We recommend signing up for the patient portal called "MyChart".  Sign up information is provided on this After Visit Summary.  MyChart is used to connect with patients for Virtual Visits (Telemedicine).  Patients are able to view lab/test results, encounter notes, upcoming appointments, etc.  Non-urgent messages can be sent to your provider as well.   To learn more about what you can do with MyChart, go to [email or web address].    Your next appointment:   Please schedule a 3 month follow up appointment with Dr. Cristopher Peru   The format for your next appointment:   In Person  Provider:   Cristopher Peru, MD{or one of the following Advanced Practice Providers on your designated Care Team:   Tommye Standard, [place] Legrand Como "[facility]" [place], [place]

## 2022-08-06 NOTE — Telephone Encounter (Signed)
Pt c/o medication issue:  1. Name of Medication: metoprolol tartrate (LOPRESSOR) 25 MG tablet    2. How are you currently taking this medication (dosage and times per [place])? Take 12.5 mg by mouth 2 (two) times daily. - Oral   3. Are you having a reaction (difficulty breathing--STAT)?   4. What is your medication issue? Pt called wanting to know if Dr. Lovena Le can switch this extended release and once a Cardo[place] so it can last longer. Please advise

## 2022-08-06 NOTE — Telephone Encounter (Signed)
Called patient back to address request to change to extended release beta blocker.   Per Dr. Lovena Le, Pt will now take Toprol XL 25 mg once daily, after meals and prior to bedtime.    Confirmed Pt pharmacy, and Lopressor removed from Pt MAR, and Toprol XL 25 mg order sent to pharmacy.

## 2022-08-06 NOTE — Progress Notes (Signed)
HPI Madeline Flores is referred by Dr. Shirlee Latch for evaluation of palpitations and SVT. She is a pleasant 47 yo woman with a h/o autonomic dysfunction diagnosed in her 73's with a tilt table when she lived in [place]. She has had an episode of heart racing where her Apple watch showed a HR of 210. She was in the office today when she had another where her heart rate was briefly over 200 though we did not catch on the ECG. She wore a zio monitor which showed brief NS SVT, unable to assess the mechanism due to the short nature and artifact.  Allergies  Allergen Reactions   Clarithromycin     REACTION: UPSET STOMACH   Gluten Meal Nausea And Vomiting   Wheat Bran Nausea And Vomiting     Current Outpatient Medications  Medication Sig Dispense Refill   hydrocortisone cream 0.5 % Apply 1 application topically daily as needed for itching.  30 g 0   hydroxychloroquine (PLAQUENIL) 200 MG tablet Take 1 tablet (200 mg total) by mouth daily. 30 tablet 2   metoprolol tartrate (LOPRESSOR) 25 MG tablet Take 12.5 mg by mouth 2 (two) times daily.     mupirocin ointment (BACTROBAN) 2 % daily.      tacrolimus (PROTOPIC) 0.03 % ointment APPLY TO AFFECTED AREA EVERY Back     amoxicillin-clavulanate (AUGMENTIN) 875-125 MG tablet Take 1 tablet by mouth 2 (two) times daily. As needed     doxycycline (VIBRA-TABS) 100 MG tablet Take 100 mg by mouth 2 (two) times daily.     Fluocinolone Acetonide Scalp 0.01 % OIL Apply topically.     fluocinonide (LIDEX) 0.05 % external solution fluocinonide 0.05 % topical solution     ketoconazole (NIZORAL) 2 % shampoo Apply topically 2 (two) times a week.     No current facility-administered medications for this visit.     Past Medical History:  Diagnosis Date   Celiac sprue    Connective tissue disease (HCC)    Diabetes mellitus without complication (HCC)    Gestational diabetes    Heart murmur    Osteopenia    Psoriasis    per patient    Rosacea    per patient      ROS:   All systems reviewed and negative except as noted in the HPI.   Past Surgical History:  Procedure Laterality Date   BARTHOLIN GLAND CYST EXCISION     CESAREAN SECTION  2010   CESAREAN SECTION N/A 01/27/2017   Procedure: REPEAT CESAREAN SECTION;  Surgeon: Levi Aland, MD;  Location: [facility];  Service: Obstetrics;  Laterality: N/A;   DENTAL SURGERY  2021   KNEE ARTHROSCOPY     WISDOM TOOTH EXTRACTION       Family History  Problem Relation Age of Onset   Heart disease Mother    Hyperlipidemia Mother    Hypertension Mother    Sarcoidosis Mother    Asthma Brother    Hyperlipidemia Father    Hypertension Father    Healthy Daughter      Social History   Socioeconomic History   Marital status: Married    Spouse name: Not on file   Number of children: Not on file   Years of education: Not on file   Highest education level: Not on file  Occupational History   Not on file  Tobacco Use   Smoking status: Never   Smokeless tobacco: Never  Vaping Use   Vaping  Use: Never used  Substance and Sexual Activity   Alcohol use: No   Drug use: No   Sexual activity: Yes    Birth control/protection: None  Other Topics Concern   Not on file  Social History Narrative   Not on file   Social Determinants of Health   Financial Resource Strain: Not on file  Food Insecurity: Not on file  Transportation Needs: Not on file  Physical Activity: Not on file  Stress: Not on file  Social Connections: Not on file  Intimate Partner Violence: Not on file     BP 136/84   Pulse 97   Ht 5' 3.5" (1.613 m)   Wt 109 lb (49.4 kg)   LMP 07/23/2022 (Approximate)   BMI 19.01 kg/m   Physical Exam:  Well appearing NAD HEENT: Unremarkable Neck:  No JVD, no thyromegally Lymphatics:  No adenopathy Back:  No CVA tenderness Lungs:  Clear with no wheezes HEART:  Regular rate rhythm, no murmurs, no rubs, no clicks Abd:  soft, positive bowel sounds, no organomegally,  no rebound, no guarding Ext:  2 plus pulses, no edema, no cyanosis, no clubbing Skin:  No rashes no nodules Neuro:  CN II through XII intact, motor grossly intact  EKG - nsr  Assess/Plan: Probable SVT - I discussed the benign nature of her symptoms and also reviewed the treatment options and recommend watchful waiting and continued her beta blocker. I also asked her to reduce her caffeine intake. She does not drink ETOH. If her symtoms worse and do not respond to a  beta blocker, then an EP study would be  a consideration.  Cristopher Peru, MD

## 2022-08-12 ENCOUNTER — Telehealth: Payer: Self-pay | Admitting: Internal Medicine

## 2022-08-12 NOTE — Telephone Encounter (Signed)
Called Pt back per message received in [place] Triage.    Pt states that her Toprol XL 25 mg daily is causing her blood pressure to drop, and has caused her heart to beat more forcefully.  BP shared with me on the telephone: 101/71, 103/66, HR around 74 bpm.    Pt took Toprol XL 25 mg for 3 days, then decreased her dose to 12.5 mg daily per Dr. Marigene Ehlers, family friend, and this change did not help, she states?   Pt has increased her sodium to help her BP, but states she is having difficulties Toprol XL.  I asked her about Lopressor, and Pt states she did not feel well on this either.  ( Non-option per Pt.) Her symptoms seem to worsen in the afternoon with the hypotension and forceful heart beats.   Consulted Ms. Tommye Standard, PA-C on Pt concern.  Per Ms. Charlcie Cradle, PA-C ok to go back to Lopressor if patient wants to?  Pt called back, stated she did not, and Lopressor does not make her feel well either.   Scheduling contacted, and Pt put on Dr. Tanna Furry 08/15/2022 at 900 am scheduled to discuss different med for SVT treatment.  No other EP APP appointments available.   Pt stated she will come in to see Dr. Lovena Le and appreciated the appointment.

## 2022-08-12 NOTE — Telephone Encounter (Signed)
Pt c/o medication issue:  1. Name of Medication: metoprolol succinate (TOPROL-XL) 25 MG 24 hr tablet   2. How are you currently taking this medication (dosage and times per [place])?   3. Are you having a reaction (difficulty breathing--STAT)?   4. What is your medication issue? Patient states the medication is too strong for her, she can't tolerate it.  It's causing for her heart to beat forcefully, causing her BP to be too low, she can't sleep.  She took it for 3 days at the full dose, then she cut the tablet in 1/2 for two nights. She is wondering if there is a liquid form of the medication she can try so she can take a lower dose as she can't cut the tablet in quarters.

## 2022-08-15 ENCOUNTER — Ambulatory Visit: Payer: BLUE CROSS/BLUE SHIELD | Attending: Internal Medicine | Admitting: Internal Medicine

## 2022-08-15 ENCOUNTER — Encounter: Payer: Self-pay | Admitting: Internal Medicine

## 2022-08-15 VITALS — BP 122/78 | HR 79 | Ht 63.5 in | Wt 109.0 lb

## 2022-08-15 DIAGNOSIS — I471 Supraventricular tachycardia, unspecified: Secondary | ICD-10-CM | POA: Diagnosis not present

## 2022-08-15 DIAGNOSIS — R002 Palpitations: Secondary | ICD-10-CM | POA: Diagnosis not present

## 2022-08-15 NOTE — Patient Instructions (Addendum)
Medication Instructions:  Your physician recommends that you continue on your current medications as directed. Please refer to the Current Medication list given to you today.  *If you need a refill on your cardiac medications before your next appointment, please call your pharmacy*  Lab Work: None ordered.  If you have labs (blood work) drawn today and your tests are completely normal, you will receive your results only by: [place] (if you have MyChart) OR A paper copy in the mail If you have any lab test that is abnormal or we need to change your treatment, we will call you to review the results.  Testing/Procedures: None ordered.  Follow-Up: At [facility], you and your health needs are our priority.  As part of our continuing mission to provide you with exceptional heart care, we have created designated Provider Care Teams.  These Care Teams include your primary Cardiologist (physician) and Advanced Practice Providers (APPs -  Physician Assistants and Nurse Practitioners) who all work together to provide you with the care you need, when you need it.  We recommend signing up for the patient portal called "MyChart".  Sign up information is provided on this After Visit Summary.  MyChart is used to connect with patients for Virtual Visits (Telemedicine).  Patients are able to view lab/test results, encounter notes, upcoming appointments, etc.  Non-urgent messages can be sent to your provider as well.   To learn more about what you can do with MyChart, go to [email or web address].    Your next appointment:   11/06/2022 at 1030 am with Dr. Lovena Le  The format for your next appointment:   In Person  Provider:   Cristopher Peru, MD{or one of the following Advanced Practice Providers on your designated Care Team:   Tommye Standard, [place] Legrand Como "[facility]" [place], [place]  Important Information About Sugar

## 2022-08-15 NOTE — Progress Notes (Signed)
HPI Mrs. Wickliff returns today for an unscheduled visit. She is a pleasant 47 yo woman who I saw last week with palpitaitons and a h/o suggestive of SVT. No documentation. She wore a cardiac monitor which demonstrated very brief episodes of either NS SVT. She was prescribed low dose toprol but felt terrible.  Allergies  Allergen Reactions   Clarithromycin     REACTION: UPSET STOMACH   Gluten Meal Nausea And Vomiting   Wheat Bran Nausea And Vomiting     Current Outpatient Medications  Medication Sig Dispense Refill   hydrocortisone cream 0.5 % Apply 1 application topically daily as needed for itching.  30 g 0   hydroxychloroquine (PLAQUENIL) 200 MG tablet Take 1 tablet (200 mg total) by mouth daily. 30 tablet 2   metoprolol succinate (TOPROL-XL) 25 MG 24 hr tablet Take 1 tablet (25 mg total) by mouth daily. After dinner, prior to bedtime. 90 tablet 3   mupirocin ointment (BACTROBAN) 2 % daily.      tacrolimus (PROTOPIC) 0.03 % ointment APPLY TO AFFECTED AREA EVERY [place]     No current facility-administered medications for this visit.     Past Medical History:  Diagnosis Date   Celiac sprue    Connective tissue disease ([place])    Diabetes mellitus without complication ([place])    Gestational diabetes    Heart murmur    Osteopenia    Psoriasis    per patient    Rosacea    per patient     ROS:   All systems reviewed and negative except as noted in the HPI.   Past Surgical History:  Procedure Laterality Date   BARTHOLIN GLAND CYST EXCISION     CESAREAN SECTION  2010   CESAREAN SECTION N/A 01/27/2017   Procedure: REPEAT CESAREAN SECTION;  Surgeon: Olga Millers, MD;  Location: [facility];  Service: Obstetrics;  Laterality: N/A;   DENTAL SURGERY  2021   KNEE ARTHROSCOPY     WISDOM TOOTH EXTRACTION       Family History  Problem Relation Age of Onset   Heart disease Mother    Hyperlipidemia Mother    Hypertension Mother    Sarcoidosis Mother    Asthma  Brother    Hyperlipidemia Father    Hypertension Father    Healthy Daughter      Social History   Socioeconomic History   Marital status: Married    Spouse name: Not on file   Number of children: Not on file   Years of education: Not on file   Highest education level: Not on file  Occupational History   Not on file  Tobacco Use   Smoking status: Never   Smokeless tobacco: Never  Vaping Use   Vaping Use: Never used  Substance and Sexual Activity   Alcohol use: No   Drug use: No   Sexual activity: Yes    Birth control/protection: None  Other Topics Concern   Not on file  Social History Narrative   Not on file   Social Determinants of Health   Financial Resource Strain: Not on file  Food Insecurity: Not on file  Transportation Needs: Not on file  Physical Activity: Not on file  Stress: Not on file  Social Connections: Not on file  Intimate Partner Violence: Not on file     BP 122/78   Pulse 79   Ht 5' 3.5" (1.613 m)   Wt 109 lb (49.4 kg)  LMP 07/23/2022 (Approximate)   SpO2 99%   BMI 19.01 kg/m   Physical Exam:  Well appearing NAD HEENT: Unremarkable Neck:  No JVD, no thyromegally Lymphatics:  No adenopathy Back:  No CVA tenderness Lungs:  Clear with no wheezes HEART:  Regular rate rhythm, no murmurs, no rubs, no clicks Abd:  soft, positive bowel sounds, no organomegally, no rebound, no guarding Ext:  2 plus pulses, no edema, no cyanosis, no clubbing Skin:  No rashes no nodules Neuro:  CN II through XII intact, motor grossly intact   Assess/Plan: Probable SVT - I asked her to go to the [employer] store and learn how to record an ecg on her apple watch and then download to her phone and send to our office. She will hold on the daily toprol and use only if she goes into SVT. I revisted why we do not recommend catheter ablation unless her symptoms worsen. I'll see her back in 3 months. Hopefully we will have caught an episode of SVT.  Carleene Overlie Cinzia Devos,MD

## 2022-08-19 ENCOUNTER — Encounter: Payer: Self-pay | Admitting: Physician Assistant

## 2022-08-22 ENCOUNTER — Other Ambulatory Visit: Payer: Self-pay | Admitting: Internal Medicine

## 2022-08-22 ENCOUNTER — Ambulatory Visit
Admission: RE | Admit: 2022-08-22 | Discharge: 2022-08-22 | Disposition: A | Discharge status: Home / Self Care | Payer: BLUE CROSS/BLUE SHIELD | Source: Ambulatory Visit | Attending: Internal Medicine | Admitting: Internal Medicine

## 2022-08-22 ENCOUNTER — Encounter: Payer: Self-pay | Admitting: Internal Medicine

## 2022-08-22 DIAGNOSIS — R0789 Other chest pain: Secondary | ICD-10-CM

## 2022-08-22 DIAGNOSIS — R079 Chest pain, unspecified: Secondary | ICD-10-CM | POA: Diagnosis not present

## 2022-08-22 DIAGNOSIS — R059 Cough, unspecified: Secondary | ICD-10-CM | POA: Diagnosis not present

## 2022-08-22 DIAGNOSIS — N926 Irregular menstruation, unspecified: Secondary | ICD-10-CM | POA: Diagnosis not present

## 2022-08-26 ENCOUNTER — Encounter: Payer: Self-pay | Admitting: Internal Medicine

## 2022-08-26 ENCOUNTER — Telehealth: Payer: Self-pay | Admitting: Internal Medicine

## 2022-08-26 DIAGNOSIS — I471 Supraventricular tachycardia, unspecified: Secondary | ICD-10-CM

## 2022-08-26 DIAGNOSIS — R002 Palpitations: Secondary | ICD-10-CM

## 2022-08-26 NOTE — Telephone Encounter (Signed)
Pt called back per message received in [place] Triage.   Pt states that she received her [place] this weekend, and it showed AFIB.  She has felt weird over the weekend.  Pt states this morning her HR was 157 to 104, with palpitations, but no other cardiac symptoms.   Pt has taken Toprol XL in the past, but states it makes her strange, and makes it difficult to lay flat to sleep at night.  ( She states she sits upright to sleep / rest. )   Pt called family friend Dr. Marigene Ehlers, and he advised she take 1/2 or 12.5 mg of Toprol XL to slow her HR down.  He mentioned talking to Dr. Lovena Le about an Ablation?  The medication worked and her HR was 76 during our conversation on the telephone.  Pt has an appointment scheduled for 11/06/22 at 1030 am with Dr. Lovena Le, but is concerned that this appointment is too far out.   Pt has an EKG that she wants Dr. Lovena Le to see, and will email to my [facility] email address for Dr. Lovena Le to review in clinic tomorrow.   Pt advised to continue to monitor symptoms, and if they worsen over night, or she has any new symptoms, to go to the nearest ER.   Will consult Dr. Lovena Le, and follow up with patient.

## 2022-08-26 NOTE — Telephone Encounter (Signed)
Patient c/o Palpitations:  High priority if patient c/o lightheadedness, shortness of breath, or chest pain  How long have you had palpitations/irregular HR/ Afib? Are you having the symptoms now?  [place] showed afib about 20 minutes ago Currently tachycardic, but no longer in afib   Are you currently experiencing lightheadedness, SOB or CP?  No   Do you have a history of afib (atrial fibrillation) or irregular heart rhythm?  No  Have you checked your BP or HR? (document readings if available):  HR 157 when in afib earlier, 104 currently Patient has not checked BP  Are you experiencing any other symptoms?  Palpitations

## 2022-08-27 NOTE — Telephone Encounter (Signed)
Pt called back after showing Dr Lovena Le the [facility] monitor report / EKG.    Per Dr. Lovena Le, the [place] report mostly showed noise / artifact, and he thought that NSR was probable.    Dr. Lovena Le stated, if Pt is having trouble with Toprol XL, she may have: Bisoprolol 2.5 mg by mouth, once daily.  This order is active if she wants to try this medication instead of Toprol XL.  Pt states her PCP had her on Lopressor 25 mg daily.  Pt has stopped taking Toprol XL, and is now trying Lopressor 12.5 mg in the morning, and 12.5 mg in the evening before bedtime.  Pt states this seems to be working better, but will let us know what she wants to do, regarding the Bisoprolol 2.5 mg offer, on Friday by calling us back.  ( I told Pt I want to know what she is taking / what is working best for her by the end of the week, so I can update her MAR, and let Dr. Lovena Le know. )  Pt is scheduled for an Echocardiogram with [place] on Monday, 09/02/2022.  She had some questions about this vs the EKG, and we talking in detail about what data is available to the providers, and how they use the data to establish a plan of care.  Pt stated she will share the results with [facility] providers / Dr. Lovena Le.    I am in clinic with Dr. Lovena Le on 08/30/2022, and Pt is going to send another [place] report to have Dr. Lovena Le assess her QRS complex on the EKG, and let us know how her Lopressor is working, so we can update Dr. Lovena Le, and update her MAR.  Follow up is still required.

## 2022-08-28 MED ORDER — BISOPROLOL FUMARATE 5 MG PO TABS: 2.5000 mg | ORAL_TABLET | Freq: Every day | ORAL | 0 refills | Status: DC

## 2022-08-28 NOTE — Telephone Encounter (Signed)
Pt emailed me;  I called her and she stated her Lopressor was making her feel ill, and uncomfortable.  Pt tried taking 12.5 mg Lopressor in the morning, and 12.5 mg in the evening; This did not work for her.    Yesterday Dr Lovena Le gave me an order to allow the patient to try, Bisoprolol 2.5 mg by mouth, once daily.      See my notes....  Pt agreed to trying this, but only dispensed enough for 2 weeks to see how she does on this medication.  I confirmed her pharmacy, and sent it in at 550 pm on 08/28/2022.  Pt was thankful to try something new.    Pt will let us know Friday how she is doing on Bisoprolol 2.5 mg.  Follow up still required.

## 2022-08-28 NOTE — Addendum Note (Signed)
Addended by: Oleta Mouse C on: 08/28/2022 05:58 PM   Modules accepted: Orders

## 2022-08-29 ENCOUNTER — Encounter: Payer: Self-pay | Admitting: Cardiology

## 2022-09-02 ENCOUNTER — Telehealth: Payer: Self-pay

## 2022-09-02 ENCOUNTER — Encounter: Payer: Self-pay | Admitting: Internal Medicine

## 2022-09-02 DIAGNOSIS — I479 Paroxysmal tachycardia, unspecified: Secondary | ICD-10-CM | POA: Diagnosis not present

## 2022-09-02 NOTE — Telephone Encounter (Signed)
Pt called back regarding Lt sided chest pain / pressure concern from phone message.  Pt states she is taking Lopressor 6.25 mg BID, and continues to have intermittent chest pain / pressure, and forcefull heart beats while taking the beta blocker Lopressor.  Pt thinks the beta blocker is causing this.    When questioned about the Zebeta 5 mg called into her pharmacy per Dr. Tanna Furry orders, Pt stated she did not fill or try this medication and decided to continue taking the Lopressor?    Pt wants me to ask Dr Lovena Le for a different medication option, something other than a Beta blocker, and an EKG to check her Heart Rhythm.  I told Pt I had no problem and will ask him these questions;  However, concerned for this patients safety, I recommended a visit to the ER for a providers care / assessment due to the type and location of her pain.  I told patient that I would ask Dr. Lovena Le her questions, and follow up with her while in clinic tomorrow morning.  Pt may reach out to her friend Dr. Aundra Dubin to consult regarding the feelings in her chest.  Pt understood for worsening or new cardiac symptoms to go to the nearest ER, and not wait on a follow up call from me.

## 2022-09-02 NOTE — Telephone Encounter (Signed)
Error

## 2022-09-02 NOTE — Telephone Encounter (Signed)
Pt Ms. Tobin sent two Kardia EKG recordings through Email for Dr. Lovena Le to review on Friday, 08/30/2022;  Both Kardia EKG recording where read as Normal Sinus Rhythm.  Per Dr. Lovena Le, normal reports, with some artifact present.

## 2022-09-03 NOTE — Telephone Encounter (Signed)
Per Dr. Lovena Le, 09/03/2022, stop taking ALL beta blockers.  Zebeta removed from Pt chart;  Pt will be made aware Zebeta is no longer advised to take.  Dr. Lovena Le wants patient to start walking, make sure she gets enough salt, hydrates, and exercises daily.  If symptoms continue after being off beta blockers and exercising regularly, a follow up appointment with Gen [facility]iology is recommended.

## 2022-09-10 DIAGNOSIS — R002 Palpitations: Secondary | ICD-10-CM | POA: Diagnosis not present

## 2022-09-10 DIAGNOSIS — I479 Paroxysmal tachycardia, unspecified: Secondary | ICD-10-CM | POA: Diagnosis not present

## 2022-09-10 DIAGNOSIS — I471 Supraventricular tachycardia, unspecified: Secondary | ICD-10-CM | POA: Diagnosis not present

## 2022-09-10 DIAGNOSIS — I4719 Other supraventricular tachycardia: Secondary | ICD-10-CM | POA: Diagnosis not present

## 2022-09-10 DIAGNOSIS — R0789 Other chest pain: Secondary | ICD-10-CM | POA: Diagnosis not present

## 2022-09-11 DIAGNOSIS — R1013 Epigastric pain: Secondary | ICD-10-CM | POA: Diagnosis not present

## 2022-09-11 DIAGNOSIS — R109 Unspecified abdominal pain: Secondary | ICD-10-CM | POA: Diagnosis not present

## 2022-09-13 ENCOUNTER — Institutional Professional Consult (permissible substitution): Payer: BLUE CROSS/BLUE SHIELD | Admitting: Cardiology

## 2022-09-17 DIAGNOSIS — R1013 Epigastric pain: Secondary | ICD-10-CM | POA: Diagnosis not present

## 2022-09-17 DIAGNOSIS — D1803 Hemangioma of intra-abdominal structures: Secondary | ICD-10-CM | POA: Diagnosis not present

## 2022-09-17 DIAGNOSIS — R112 Nausea with vomiting, unspecified: Secondary | ICD-10-CM | POA: Diagnosis not present

## 2022-09-17 DIAGNOSIS — K769 Liver disease, unspecified: Secondary | ICD-10-CM | POA: Diagnosis not present

## 2022-09-17 DIAGNOSIS — K449 Diaphragmatic hernia without obstruction or gangrene: Secondary | ICD-10-CM | POA: Diagnosis not present

## 2022-10-04 DIAGNOSIS — M329 Systemic lupus erythematosus, unspecified: Secondary | ICD-10-CM | POA: Diagnosis not present

## 2022-10-04 DIAGNOSIS — Z79899 Other long term (current) drug therapy: Secondary | ICD-10-CM | POA: Diagnosis not present

## 2022-10-15 ENCOUNTER — Encounter: Payer: Self-pay | Admitting: Internal Medicine

## 2022-10-22 ENCOUNTER — Telehealth: Payer: Self-pay

## 2022-10-22 ENCOUNTER — Telehealth: Payer: Self-pay | Admitting: Cardiovascular Disease

## 2022-10-22 NOTE — Telephone Encounter (Signed)
-----   Message from Evans Lance, MD sent at 10/21/2022  9:20 PM EDT ----- Regarding: RE: Pt has a couple questions / Wants MyChart message answer if possible Good question. Coronary spasm if fairly rare at least the bad kind. That said, a calcium channel blocker like cardizem would be reasonable as an alternative. We use cardizem for the treatment of spasm. A 2D echo would tell if there is any evidence of heart damage. GT ----- Message ----- From: Varney Daily, RN Sent: 10/15/2022   1:33 PM EDT To: Evans Lance, MD Subject: Pt has a couple questions / Wants MyChart me#  Good afternoon Dr. Lovena Le,  Ms. Mcentee had a few questions for you, and reached out to [place] via MyChart.  She was requesting a MyChart message back when you get a moment. See her MyChart message below. Thank you! Merrilee Seashore   Ms. Modi message-    Good morning Dr Lovena Le, I have been doing some research to try and find out the cause of my severe heart pain/cramping when on metaprolol Merrilee Seashore and I spoke on the phone during one of the episodes). I have read that coronary artery spasms can occur and are more likely in individuals with Raynauds (which I have). So I have a few questions: 1. Is there anything I can safely take if I have an SVT episode? 2. Could I have sustained tissue injury from CAS/ ischemia? Is there testing we can do to rule out damage?  Thank you! Adrain

## 2022-10-22 NOTE — Telephone Encounter (Signed)
  Pt would like to re-establish care with Dr. Burt Knack. She said, her husband is Denyse Amass Andaya and a good friend of Dr. Burt Knack and wants to be under his care again

## 2022-10-23 NOTE — Telephone Encounter (Signed)
Attempted to return call to patient, left voicemail. Last seen 2013 w/advisement to follow-up as needed. Will schedule with [facility] upon return call.

## 2022-10-24 NOTE — Telephone Encounter (Signed)
Pt returning call

## 2022-10-24 NOTE — Telephone Encounter (Signed)
Completed and pt scheduled w/ [facility] for 02/03/23 @11 .

## 2022-11-06 ENCOUNTER — Ambulatory Visit: Payer: BLUE CROSS/BLUE SHIELD | Admitting: Internal Medicine

## 2022-11-21 DIAGNOSIS — M329 Systemic lupus erythematosus, unspecified: Secondary | ICD-10-CM | POA: Diagnosis not present

## 2022-11-21 DIAGNOSIS — M5431 Sciatica, right side: Secondary | ICD-10-CM | POA: Diagnosis not present

## 2022-11-26 DIAGNOSIS — I471 Supraventricular tachycardia, unspecified: Secondary | ICD-10-CM | POA: Diagnosis not present

## 2022-11-26 DIAGNOSIS — M329 Systemic lupus erythematosus, unspecified: Secondary | ICD-10-CM | POA: Diagnosis not present

## 2022-11-26 DIAGNOSIS — R002 Palpitations: Secondary | ICD-10-CM | POA: Diagnosis not present

## 2023-01-06 DIAGNOSIS — H02729 Madarosis of unspecified eye, unspecified eyelid and periocular area: Secondary | ICD-10-CM | POA: Diagnosis not present

## 2023-01-06 DIAGNOSIS — L219 Seborrheic dermatitis, unspecified: Secondary | ICD-10-CM | POA: Diagnosis not present

## 2023-01-06 DIAGNOSIS — L409 Psoriasis, unspecified: Secondary | ICD-10-CM | POA: Diagnosis not present

## 2023-01-06 DIAGNOSIS — R229 Localized swelling, mass and lump, unspecified: Secondary | ICD-10-CM | POA: Diagnosis not present

## 2023-01-08 DIAGNOSIS — R229 Localized swelling, mass and lump, unspecified: Secondary | ICD-10-CM | POA: Diagnosis not present

## 2023-01-08 DIAGNOSIS — M7989 Other specified soft tissue disorders: Secondary | ICD-10-CM | POA: Diagnosis not present

## 2023-02-03 ENCOUNTER — Encounter: Payer: Self-pay | Admitting: Cardiovascular Disease

## 2023-02-03 ENCOUNTER — Ambulatory Visit: Payer: BC Managed Care – PPO | Attending: Cardiovascular Disease | Admitting: Cardiovascular Disease

## 2023-02-03 VITALS — BP 132/78 | HR 82 | Ht 63.0 in | Wt 111.0 lb

## 2023-02-03 DIAGNOSIS — I471 Supraventricular tachycardia, unspecified: Secondary | ICD-10-CM | POA: Diagnosis not present

## 2023-02-03 DIAGNOSIS — R2231 Localized swelling, mass and lump, right upper limb: Secondary | ICD-10-CM | POA: Diagnosis not present

## 2023-02-03 MED ORDER — DILTIAZEM HCL 30 MG PO TABS: ORAL_TABLET | ORAL | 3 refills | Status: DC

## 2023-02-03 NOTE — Progress Notes (Signed)
Cardiology Office Note:    Date:  02/03/2023   ID:  Madeline Flores, DOB 1975/07/30, MRN [id number]  PCP:  Collene Mares, PA   [facility] Providers Cardiologist:  None     Referring MD: Collene Mares, [place]   Chief Complaint  Patient presents with   Palpitations    History of Present Illness:    Madeline Flores is a 47 y.o. female presents for evaluation of SVT/palpitations.  The patient saw Dr. Ladona Ridgel in January 2024 after an episode of suspected SVT.  She has a longstanding history of autonomic dysfunction with abnormal tilt table study.  She has managed this with adequate fluid and salt intake.  She developed an acute episode of heart racing when her heart rate was measured at 210 bpm on her Apple watch.  She was tried on a low-dose of 2 different beta-blockers but had horrible intolerance to both of them.  She describes fatigue, weakness, chest pain, and feeling like she could not get out of bed.  She presents today with questions about whether she might have coronary vasospasm exacerbated by beta-blocker use.  She has not had any more chest pain since stopping the beta-blocker.  She denies shortness of breath.  She is physically active with regular exercise and she went for a brisk walk yesterday with no symptoms at all.  She continues to have some fleeting heart palpitations but is doing fairly well in that regard.  Past Medical History:  Diagnosis Date   Celiac sprue    Connective tissue disease (HCC)    Diabetes mellitus without complication (HCC)    Gestational diabetes    Heart murmur    Osteopenia    Psoriasis    per patient    Rosacea    per patient     Past Surgical History:  Procedure Laterality Date   BARTHOLIN GLAND CYST EXCISION     CESAREAN SECTION  2010   CESAREAN SECTION N/A 01/27/2017   Procedure: REPEAT CESAREAN SECTION;  Surgeon: Levi Aland, MD;  Location: [facility] BIRTHING SUIT[facility];  Service: Obstetrics;  Laterality: N/A;   DENTAL SURGERY   2021   KNEE ARTHROSCOPY     WISDOM TOOTH EXTRACTION      Current Medications: Current Meds  Medication Sig   diltiazem (CARDIZEM) 30 MG tablet Take 1 tablet by mouth as needed for racing heart, up to twice daily   hydrocortisone cream 0.5 % Apply 1 application topically daily as needed for itching.    mupirocin ointment (BACTROBAN) 2 % daily.    tacrolimus (PROTOPIC) 0.03 % ointment APPLY TO AFFECTED AREA EVERY Smucker   [DISCONTINUED] hydroxychloroquine (PLAQUENIL) 200 MG tablet Take 1 tablet (200 mg total) by mouth daily.     Allergies:   Clarithromycin, Gluten meal, and Wheat   Social History   Socioeconomic History   Marital status: Married    Spouse name: Not on file   Number of children: Not on file   Years of education: Not on file   Highest education level: Not on file  Occupational History   Not on file  Tobacco Use   Smoking status: Never   Smokeless tobacco: Never  Vaping Use   Vaping Use: Never used  Substance and Sexual Activity   Alcohol use: No   Drug use: No   Sexual activity: Yes    Birth control/protection: None  Other Topics Concern   Not on file  Social History Narrative   Not on  file   Social Determinants of Health   Financial Resource Strain: Not on file  Food Insecurity: Not on file  Transportation Needs: Not on file  Physical Activity: Not on file  Stress: Not on file  Social Connections: Not on file     Family History: The patient's family history includes Asthma in her brother; Healthy in her daughter; Heart disease in her mother; Hyperlipidemia in her father and mother; Hypertension in her father and mother; Sarcoidosis in her mother.  ROS:   Please see the history of present illness.    All other systems reviewed and are negative.  EKGs/Labs/Other Studies Reviewed:         Recent Labs: No results found for requested labs within last 365 days.  Recent Lipid Panel    Component Value Date/Time   CHOL 175 09/14/2014 0923   TRIG  51.0 09/14/2014 0923   HDL 65.00 09/14/2014 0923   CHOLHDL 3 09/14/2014 0923   VLDL 10.2 09/14/2014 0923   LDLCALC 100 (H) 09/14/2014 0923   LDLDIRECT 141.6 05/01/2010 0942     Risk Assessment/Calculations:                Physical Exam:    VS:  BP 132/78   Pulse 82   Ht 5\' 3"  (1.6 m)   Wt 111 lb (50.3 kg)   SpO2 97%   BMI 19.66 kg/m     Wt Readings from Last 3 Encounters:  02/03/23 111 lb (50.3 kg)  08/15/22 109 lb (49.4 kg)  08/06/22 109 lb (49.4 kg)     GEN:  Well nourished, well developed physically fit woman in no acute distress HEENT: Normal NECK: No JVD; No carotid bruits LYMPHATICS: No lymphadenopathy CARDIAC: RRR, no murmurs, rubs, gallops RESPIRATORY:  Clear to auscultation without rales, wheezing or rhonchi  ABDOMEN: Soft, non-tender, non-distended MUSCULOSKELETAL:  No edema; No deformity  SKIN: Warm and dry NEUROLOGIC:  Alert and oriented x 3 PSYCHIATRIC:  Normal affect   ASSESSMENT:    1. SVT (supraventricular tachycardia)    PLAN:    In order of problems listed above:  Reviewed Dr. Lubertha Basque notes.  Reviewed ZIO monitor results from January and she had some short supraventricular runs all less than 10 beats.  She did not tolerate beta-blockade.  I do not think this is necessarily indicative of any underlying coronary vasospasm.  The patient is at low risk for obstructive CAD.  She is able to exercise without any associated symptoms.  I wrote her prescription for diltiazem 30 mg to be used only as needed for sustained heart racing or episodes of palpitations.  She otherwise will continue her regular follow-up with Dr. Ladona Ridgel, scheduled in September of this year.  I would be happy to see her back as needed in the future.     Medication Adjustments/Labs and Tests Ordered: Current medicines are reviewed at length with the patient today.  Concerns regarding medicines are outlined above.  No orders of the defined types were placed in this  encounter.  Meds ordered this encounter  Medications   diltiazem (CARDIZEM) 30 MG tablet    Sig: Take 1 tablet by mouth as needed for racing heart, up to twice daily    Dispense:  60 tablet    Refill:  3    Patient Instructions  Medication Instructions:  TAKE Diltiazem 30mg  tablet by mouth as needed for racing heart. Max of twice daily *If you need a refill on your cardiac medications before your next  appointment, please call your pharmacy*   Lab Work: NONE If you have labs (blood work) drawn today and your tests are completely normal, you will receive your results only by: MyChart Message (if you have MyChart) OR A paper copy in the mail If you have any lab test that is abnormal or we need to change your treatment, we will call you to review the results.  Testing/Procedures: NONE  Follow-Up: At [facility], you and your health needs are our priority.  As part of our continuing mission to provide you with exceptional heart care, we have created designated Provider Care Teams.  These Care Teams include your primary Cardiologist (physician) and Advanced Practice Providers (APPs -  Physician Assistants and Nurse Practitioners) who all work together to provide you with the care you need, when you need it.  Your next appointment:   As Needed  Provider:   Tonny Bollman, MD     Signed, Tonny Bollman, MD  02/03/2023 1:26 PM    [facility]

## 2023-02-03 NOTE — Patient Instructions (Signed)
Medication Instructions:  TAKE Diltiazem 30mg  tablet by mouth as needed for racing heart. Max of twice daily *If you need a refill on your cardiac medications before your next appointment, please call your pharmacy*   Lab Work: NONE If you have labs (blood work) drawn today and your tests are completely normal, you will receive your results only by: MyChart Message (if you have MyChart) OR A paper copy in the mail If you have any lab test that is abnormal or we need to change your treatment, we will call you to review the results.  Testing/Procedures: NONE  Follow-Up: At [facility], you and your health needs are our priority.  As part of our continuing mission to provide you with exceptional heart care, we have created designated Provider Care Teams.  These Care Teams include your primary Cardiologist (physician) and Advanced Practice Providers (APPs -  Physician Assistants and Nurse Practitioners) who all work together to provide you with the care you need, when you need it.  Your next appointment:   As Needed  Provider:   Tonny Bollman, MD

## 2023-02-13 ENCOUNTER — Ambulatory Visit: Payer: BLUE CROSS/BLUE SHIELD | Admitting: Internal Medicine

## 2023-03-03 ENCOUNTER — Institutional Professional Consult (permissible substitution): Payer: BLUE CROSS/BLUE SHIELD | Admitting: Plastic Surgery

## 2023-04-01 ENCOUNTER — Ambulatory Visit: Payer: BC Managed Care – PPO | Admitting: Internal Medicine

## 2023-04-08 DIAGNOSIS — R519 Headache, unspecified: Secondary | ICD-10-CM | POA: Diagnosis not present

## 2023-04-08 DIAGNOSIS — H9202 Otalgia, left ear: Secondary | ICD-10-CM | POA: Diagnosis not present

## 2023-08-19 ENCOUNTER — Ambulatory Visit: Payer: BC Managed Care – PPO | Admitting: Podiatry

## 2023-08-19 ENCOUNTER — Encounter: Payer: Self-pay | Admitting: Podiatry

## 2023-08-19 VITALS — Ht 63.0 in | Wt 111.0 lb

## 2023-08-19 DIAGNOSIS — S90212A Contusion of left great toe with damage to nail, initial encounter: Secondary | ICD-10-CM | POA: Diagnosis not present

## 2023-08-19 DIAGNOSIS — L409 Psoriasis, unspecified: Secondary | ICD-10-CM | POA: Diagnosis not present

## 2023-08-19 DIAGNOSIS — L600 Ingrowing nail: Secondary | ICD-10-CM

## 2023-08-19 MED ORDER — DOXYCYCLINE HYCLATE 100 MG PO CAPS: 100.0000 mg | ORAL_CAPSULE | Freq: Two times a day (BID) | ORAL | 0 refills | Status: AC

## 2023-08-19 MED ORDER — SILVER SULFADIAZINE 1 % EX CREA: 1.0000 | TOPICAL_CREAM | Freq: Every day | CUTANEOUS | 0 refills | Status: AC

## 2023-08-19 NOTE — Progress Notes (Unsigned)
Chief Complaint  Patient presents with   Ingrown Toenail    Ingrown toe nail- Red/swollen great toe   HPI: 48 y.o. female presents today with concern of a possible ingrown toenail to the left great toenail along the lateral border.  Patient notes that she also has psoriasis and sometimes this will cause some skin changes in the area.  As she notes that if she does need to have any type of procedure done she does need to be prophylaxed on antibiotics.  States that she had some type of contusion to the left great toenail in the past and does have a horizontal ridge growing out with the nail.  She states that it has not caused any significant pain but it is trying to chip and split along those edges where the horizontal ridge is located.  She is concerned about some of the redness to the lateral border of that toenail.  Past Medical History:  Diagnosis Date   Celiac sprue    Connective tissue disease (HCC)    Diabetes mellitus without complication (HCC)    Gestational diabetes    Heart murmur    Osteopenia    Psoriasis    per patient    Rosacea    per patient     Past Surgical History:  Procedure Laterality Date   BARTHOLIN GLAND CYST EXCISION     CESAREAN SECTION  2010   CESAREAN SECTION N/A 01/27/2017   Procedure: REPEAT CESAREAN SECTION;  Surgeon: Levi Aland, MD;  Location: [facility] BIRTHING SUIT[facility];  Service: Obstetrics;  Laterality: N/A;   DENTAL SURGERY  2021   KNEE ARTHROSCOPY     WISDOM TOOTH EXTRACTION      Allergies  Allergen Reactions   Clarithromycin     REACTION: UPSET STOMACH   Gluten Meal Nausea And Vomiting   Wheat Nausea And Vomiting    Physical Exam: There are palpable pedal pulse on the left foot.  There is minimal localized edema and erythema to the lateral nail margin of the left hallux toenail.  This is isolated to the middle one third aspect of the nail margin.  No obvious onychocryptosis is noted today.  No nail or skin changes in the area appear  consistent with any psoriatic flareup.  There is no pitting or ridging of the nails.  There is a horizontal ridge with second toenail distally seen on the distal 25% of the left hallux nail.  There is loosening/lysis of the nail along the lateral border near this area.  No drainage is noted  Assessment/Plan of Care: 1. Contusion of left great toe with damage to nail, initial encounter   2. Ingrown nail   3. Psoriasis      Meds ordered this encounter  Medications   doxycycline (VIBRAMYCIN) 100 MG capsule    Sig: Take 1 capsule (100 mg total) by mouth 2 (two) times daily for 10 days.    Dispense:  20 capsule    Refill:  0   silver sulfADIAZINE (SILVADENE) 1 % cream    Sig: Apply 1 Application topically daily.    Dispense:  50 g    Refill:  0   Discussed clinical findings with patient today.  Informed the patient that her nail does not appear to be ingrown today.  Will send in doxycycline oral medication as well as Silvadene cream to treat this area and see if this resolves the issue.  However, if it does not we may need  to perform a nail procedure.  She will have taken that oral antibiotics so that we can proceed with the procedure at her next visit if needed.  Patient was informed if this entire issue resolves with the topical medication that she can simply call and cancel her appointment.  The nail was smoothed with a dremel to remove  Will schedule her in approximately 2 weeks for recheck and possible PNA to left hallux lateral nail border if needed.   Clerance Lav, DPM, FACFAS [facility]     [address] [place], [place] [address]                Office [phone]  Fax [phone]

## 2023-09-02 ENCOUNTER — Ambulatory Visit: Payer: BC Managed Care – PPO | Admitting: Podiatry

## 2023-09-09 DIAGNOSIS — J111 Influenza due to unidentified influenza virus with other respiratory manifestations: Secondary | ICD-10-CM | POA: Diagnosis not present

## 2023-09-15 DIAGNOSIS — J111 Influenza due to unidentified influenza virus with other respiratory manifestations: Secondary | ICD-10-CM | POA: Diagnosis not present

## 2023-11-17 DIAGNOSIS — Z01419 Encounter for gynecological examination (general) (routine) without abnormal findings: Secondary | ICD-10-CM | POA: Diagnosis not present

## 2023-11-17 DIAGNOSIS — Z1231 Encounter for screening mammogram for malignant neoplasm of breast: Secondary | ICD-10-CM | POA: Diagnosis not present

## 2023-11-17 DIAGNOSIS — Z124 Encounter for screening for malignant neoplasm of cervix: Secondary | ICD-10-CM | POA: Diagnosis not present

## 2023-12-04 DIAGNOSIS — Z938 Other artificial opening status: Secondary | ICD-10-CM | POA: Diagnosis not present

## 2024-04-15 DIAGNOSIS — L409 Psoriasis, unspecified: Secondary | ICD-10-CM | POA: Diagnosis not present

## 2024-04-15 DIAGNOSIS — I7300 Raynaud's syndrome without gangrene: Secondary | ICD-10-CM | POA: Diagnosis not present

## 2024-04-15 DIAGNOSIS — M359 Systemic involvement of connective tissue, unspecified: Secondary | ICD-10-CM | POA: Diagnosis not present

## 2024-04-15 DIAGNOSIS — Z0000 Encounter for general adult medical examination without abnormal findings: Secondary | ICD-10-CM | POA: Diagnosis not present

## 2024-04-15 DIAGNOSIS — Z23 Encounter for immunization: Secondary | ICD-10-CM | POA: Diagnosis not present

## 2024-05-12 DIAGNOSIS — Z1211 Encounter for screening for malignant neoplasm of colon: Secondary | ICD-10-CM | POA: Diagnosis not present

## 2024-05-12 DIAGNOSIS — Z1212 Encounter for screening for malignant neoplasm of rectum: Secondary | ICD-10-CM | POA: Diagnosis not present

## 2024-05-19 LAB — COLOGUARD: COLOGUARD: POSITIVE — AB

## 2024-07-07 NOTE — Progress Notes (Unsigned)
 Chief Complaint: Positive Cologuard and epigastric pain  HPI:    Madeline Flores is a 48 year old female, previously known to Dr. Obie, with a past medical history as listed below including celiac disease, connective tissue disease, osteopenia and psoriasis, who was referred to me by Madeline Flores  E, PA for a complaint of positive Cologuard and epigastric pain.    06/19/2012 echo with LVEF 60-65%.    12/28/2016 EGD was normal with biopsy showing benign small bowel mucosa, reactive gastropathy.    02/03/2023 office visit with cardiology for SVT.  Had tried 2 different low-dose beta-blockers but had intolerance.  At that time previous ZIO monitor results from January had showed short supraventricular runs all less than 10 beats.    04/15/2024 patient followed with PCP.  At that time discussed ongoing PVCs/NSVT, had extensive workup with cardiology in 2024 and tried on multiple medications but unable to tolerate.  At that time discussed worsening palpitations.  Interested in repeating Zio patch testing.    05/20/2024 positive Cologuard.    Today, patient presents to clinic and tells me that she has celiac disease, this was diagnosed when she was 48 years old and she went on a gluten-free diet which helped many of her GI symptoms at the time.  Most recently though over the past 3 to 4 years she has noticed a constant epigastric discomfort which she describes as a pain, sometimes worse when she eats.  Only has very occasional regurgitation of food or acid.  Currently not on a PPI.  She does stay on a gluten-free diet.  Some occasional gas, belching and diarrhea which she feels like may be related as the symptoms increased when her pain increases.    Also describes a family history of colon cancer in her grandfather and a mother and brothers with multiple polyps.    Also discusses today that she has had episodes of SVT in the past and was tried on various meds which did not work.  Most recently over the past few  months since she has had some hormonal changes (she thinks she is going into menopause), she has noticed that these episodes of SVT are increasing in frequency, wanting to follow-up with cardiology.    Denies fever, chills, weight loss or blood in her stool.  Past Medical History:  Diagnosis Date   Celiac sprue    Connective tissue disease    Diabetes mellitus without complication (HCC)    Gestational diabetes    Heart murmur    Osteopenia    Psoriasis    per patient    Rosacea    per patient     Past Surgical History:  Procedure Laterality Date   BARTHOLIN GLAND CYST EXCISION     CESAREAN SECTION  2010   CESAREAN SECTION N/A 01/27/2017   Procedure: REPEAT CESAREAN SECTION;  Surgeon: Madeline Oneil BRAVO, MD;  Location: [facility];  Service: Obstetrics;  Laterality: N/A;   DENTAL SURGERY  2021   KNEE ARTHROSCOPY     WISDOM TOOTH EXTRACTION      Current Outpatient Medications  Medication Sig Dispense Refill   diltiazem  (CARDIZEM ) 30 MG tablet Take 1 tablet by mouth as needed for racing heart, up to twice daily 60 tablet 3   hydrocortisone cream 0.5 % Apply 1 application topically daily as needed for itching.  30 g 0   mupirocin ointment (BACTROBAN) 2 % daily.      silver  sulfADIAZINE  (SILVADENE ) 1 % cream Apply 1 Application topically  daily. 50 g 0   tacrolimus (PROTOPIC) 0.03 % ointment APPLY TO AFFECTED AREA EVERY [place]     No current facility-administered medications for this visit.    Allergies as of 07/13/2024 - Review Complete 08/19/2023  Allergen Reaction Noted   Clarithromycin     Gluten meal Nausea And Vomiting 07/04/2016   Wheat Nausea And Vomiting 01/10/2017    Family History  Problem Relation Age of Onset   Heart disease Mother    Hyperlipidemia Mother    Hypertension Mother    Sarcoidosis Mother    Asthma Brother    Hyperlipidemia Father    Hypertension Father    Healthy Daughter     Social History   Socioeconomic History   Marital status:  Married    Spouse name: Not on file   Number of children: Not on file   Years of education: Not on file   Highest education level: Not on file  Occupational History   Not on file  Tobacco Use   Smoking status: Never   Smokeless tobacco: Never  Vaping Use   Vaping status: Never Used  Substance and Sexual Activity   Alcohol use: No   Drug use: No   Sexual activity: Yes    Birth control/protection: None  Other Topics Concern   Not on file  Social History Narrative   Not on file   Social Drivers of Health   Financial Resource Strain: Not on file  Food Insecurity: Low Risk (04/08/2023)   Received from [facility]   Hunger Vital Sign    Within the past 12 months, you worried that your food would run out before you got money to buy more: Never true    Within the past 12 months, the food you bought just didn't last and you didn't have money to get more. : Never true  Transportation Needs: No Transportation Needs (04/08/2023)   Received from [facility]    In the past 12 months, has lack of reliable transportation kept you from medical appointments, meetings, work or from getting things needed for daily living? : No  Physical Activity: Not on file  Stress: Not on file  Social Connections: Unknown (12/11/2021)   Received from [facility]   Social Network    Social Network: Not on file  Intimate Partner Violence: Unknown (11/02/2021)   Received from [facility]   HITS    Physically Hurt: Not on file    Insult or Talk Down To: Not on file    Threaten Physical Harm: Not on file    Scream or Curse: Not on file    Review of Systems:    Constitutional: No weight loss, fever or chills Cardiovascular: See HPI  Respiratory: No SOB  Gastrointestinal: See HPI and otherwise negative Genitourinary: No dysuria  Neurological: No headache, dizziness or syncope Musculoskeletal: No new muscle or joint pain Hematologic: No bleeding  Psychiatric: No history of  depression or anxiety   Physical Exam:  Vital signs: BP 116/70   Pulse 88   Ht 5' 4 (1.626 m)   Wt 108 lb 4 oz (49.1 kg)   LMP 05/18/2024 (Approximate)   Breastfeeding No   BMI 18.58 kg/m    Constitutional:   Pleasant thin appearing Caucasian female appears to be in NAD, Well developed, Well nourished, alert and cooperative Head:  Normocephalic and atraumatic. Eyes:   PEERL, EOMI. No icterus. Conjunctiva pink. Ears:  Normal auditory acuity. Neck:  Supple Throat: Oral cavity  and pharynx without inflammation, swelling or lesion.  Respiratory: Respirations even and unlabored. Lungs clear to auscultation bilaterally.   No wheezes, crackles, or rhonchi.  Cardiovascular: Normal S1, S2. No MRG. Regular rate and rhythm. No peripheral edema, cyanosis or pallor.  Gastrointestinal:  Soft, nondistended, moderate epigastric ttp, some worse to the left side. No rebound or guarding. Normal bowel sounds. No appreciable masses or hepatomegaly. Rectal:  Not performed.  Msk:  Symmetrical without gross deformities. Without edema, no deformity or joint abnormality.  Neurologic:  Alert and  oriented x4;  grossly normal neurologically.  Skin:   Dry and intact without significant lesions or rashes. Psychiatric: Demonstrates good judgement and reason without abnormal affect or behaviors.  RELEVANT LABS AND IMAGING: CBC    Component Value Date/Time   WBC 4.7 04/13/2020 1134   RBC 4.61 04/13/2020 1134   HGB 14.3 04/13/2020 1134   HCT 43.3 04/13/2020 1134   PLT 198 04/13/2020 1134   MCV 93.9 04/13/2020 1134   MCH 31.0 04/13/2020 1134   MCHC 33.0 04/13/2020 1134   RDW 12.1 04/13/2020 1134   LYMPHSABS 1,828 04/13/2020 1134   MONOABS 0.7 06/12/2018 1032   EOSABS 71 04/13/2020 1134   BASOSABS 28 04/13/2020 1134    CMP     Component Value Date/Time   NA 139 04/13/2020 1134   K 4.4 04/13/2020 1134   CL 104 04/13/2020 1134   CO2 24 04/13/2020 1134   GLUCOSE 79 04/13/2020 1134   BUN 14 04/13/2020  1134   CREATININE 0.84 04/13/2020 1134   CALCIUM 9.4 04/13/2020 1134   PROT 7.0 04/13/2020 1134   ALBUMIN 4.6 06/12/2018 1032   AST 20 04/13/2020 1134   ALT 12 04/13/2020 1134   ALKPHOS 111 06/12/2018 1032   BILITOT 0.5 04/13/2020 1134   GFRNONAA 85 04/13/2020 1134   GFRAA 98 04/13/2020 1134    Assessment: 1.  Positive Cologuard: In October, no prior colonoscopy, family history of colon cancer in her grandfather and multiple colon polyps in her mother and brothers 2.  PVCs/NSVT: Extensive workup in 2024 with cardiology, patient recently saw PCP and discussed repeat Zio patch as these are now occurring more frequently, patient thinks may be related to hormone changes in her body 3.  Chronic epigastric pain: For years, chronic, sometimes worse with food, very occasional heartburn; consider gastritis most likely 4.  Celiac disease: Patient maintains a gluten-free diet and for the most part can avoid symptoms which include vomiting if she gets even a trace of gluten  Plan: 1.  Patient needs cardiac clearance given history of PVCs and NSVT, since these are increasing in frequency.  We will send a note to her cardiologist Dr. Wonda.  She may need a follow-up visit with them as she is overdue.  Once we receive clearance we can schedule her.  She requests a first thing in the morning appointment given issues with hypoglycemia.  Did go ahead and discussed risk of benefits, imitations and alternatives and the patient agrees to proceed. 2.  Patient to maintain a gluten-free diet as she is doing. 3.  Started the patient on a low-dose PPI to see if it is helpful with chronic epigastric pain.  Prescribe Meprazole 20 mg daily, 30 minutes for breakfast.  #30 with 3 refills. 4.  Patient to follow in clinic per recommendations after time of procedures.  She will need EGD and colonoscopy.  She was assigned to Dr. Shila today.  Delon Failing, PA-C [facility] 07/07/2024, 10:48 AM  Cc:  Madeline Flores  E, PA

## 2024-07-13 ENCOUNTER — Encounter: Payer: Self-pay | Admitting: Physician Assistant

## 2024-07-13 ENCOUNTER — Ambulatory Visit: Admitting: Physician Assistant

## 2024-07-13 VITALS — BP 116/70 | HR 88 | Ht 64.0 in | Wt 108.2 lb

## 2024-07-13 DIAGNOSIS — K900 Celiac disease: Secondary | ICD-10-CM

## 2024-07-13 DIAGNOSIS — I472 Ventricular tachycardia, unspecified: Secondary | ICD-10-CM | POA: Diagnosis not present

## 2024-07-13 DIAGNOSIS — R195 Other fecal abnormalities: Secondary | ICD-10-CM | POA: Diagnosis not present

## 2024-07-13 DIAGNOSIS — R1013 Epigastric pain: Secondary | ICD-10-CM | POA: Diagnosis not present

## 2024-07-13 DIAGNOSIS — I493 Ventricular premature depolarization: Secondary | ICD-10-CM | POA: Diagnosis not present

## 2024-07-13 DIAGNOSIS — I4729 Other ventricular tachycardia: Secondary | ICD-10-CM | POA: Diagnosis not present

## 2024-07-13 DIAGNOSIS — G8929 Other chronic pain: Secondary | ICD-10-CM | POA: Diagnosis not present

## 2024-07-13 DIAGNOSIS — M359 Systemic involvement of connective tissue, unspecified: Secondary | ICD-10-CM | POA: Diagnosis not present

## 2024-07-13 DIAGNOSIS — I471 Supraventricular tachycardia, unspecified: Secondary | ICD-10-CM

## 2024-07-13 DIAGNOSIS — K8681 Exocrine pancreatic insufficiency: Secondary | ICD-10-CM | POA: Diagnosis not present

## 2024-07-13 MED ORDER — OMEPRAZOLE 20 MG PO CPDR: 20.0000 mg | DELAYED_RELEASE_CAPSULE | Freq: Every day | ORAL | 5 refills | Status: AC

## 2024-07-13 NOTE — Patient Instructions (Addendum)
 Will call back to schedule EGD/Colonoscopy once we receive cardiac clearance.   We have sent the following medications to your pharmacy for you to pick up at your convenience: Omeprazole  20 mg daily 30-60 minutes before breakfast.  _______________________________________________________  If your blood pressure at your visit was 140/90 or greater, please contact your primary care physician to follow up on this.  _______________________________________________________  If you are age 49 or older, your body mass index should be between 23-30. Your Body mass index is 18.58 kg/m. If this is out of the aforementioned range listed, please consider follow up with your Primary Care Provider.  If you are age 66 or younger, your body mass index should be between 19-25. Your Body mass index is 18.58 kg/m. If this is out of the aformentioned range listed, please consider follow up with your Primary Care Provider.   ________________________________________________________  The [facility] providers would like to encourage you to use MYCHART to communicate with providers for non-urgent requests or questions.  Due to long hold times on the telephone, sending your provider a message by [facility] may be a faster and more efficient way to get a response.  Please allow 48 business hours for a response.  Please remember that this is for non-urgent requests.  _______________________________________________________  [facility] is using a team-based approach to care.  Your team is made up of your doctor and two to three APPS. Our APPS (Nurse Practitioners and Physician Assistants) work with your physician to ensure care continuity for you. They are fully qualified to address your health concerns and develop a treatment plan. They communicate directly with your gastroenterologist to care for you. Seeing the Advanced Practice Practitioners on your physician's team can help you by facilitating care more  promptly, often allowing for earlier appointments, access to diagnostic testing, procedures, and other specialty referrals.

## 2024-07-15 ENCOUNTER — Telehealth: Payer: Self-pay | Admitting: Physician Assistant

## 2024-07-15 ENCOUNTER — Telehealth: Payer: Self-pay | Admitting: *Deleted

## 2024-07-15 NOTE — Telephone Encounter (Signed)
 Inbound call from patient stating her PCP is willing to give medical clearance for mild heart related issue so that patient is able to be scheduled for EGD and colon. Patient is requesting a call to discuss further. Please advise, thank you

## 2024-07-15 NOTE — Telephone Encounter (Signed)
 [facility] Pre-operative Risk Assessment     Request for surgical clearance:     Endoscopy Procedure  What type of surgery is being performed?     EGD/Colon  When is this surgery scheduled?     TBD  What type of clearance is required ?  Cardiac  Practice name and name of physician performing surgery?      [facility]  What is your office phone and fax number?      Phone- [phone]  Fax- [phone]  Anesthesia type (None, local, MAC, general) ?       MAC   Please route your response to Powell Misty, CMA

## 2024-07-16 NOTE — Telephone Encounter (Signed)
" ° °  Name: Cassundra Mckeever Captain  DOB: 1975-08-02  MRN: [id number]  Primary Cardiologist: None  Chart reviewed as part of pre-operative protocol coverage. Because of Yarissa A Denbow's past medical history and time since last visit, she will require a follow-up in-office visit in order to better assess preoperative cardiovascular risk.  Pre-op covering staff: - Please schedule appointment and call patient to inform them. If patient already had an upcoming appointment within acceptable timeframe, please add pre-op clearance to the appointment notes so provider is aware. - Please contact requesting surgeon's office via preferred method (i.e, phone, fax) to inform them of need for appointment prior to surgery.  Not taking any antiplatelet or anticoagulation.   Mardy KATHEE Pizza, FNP  07/16/2024, 9:04 AM   "

## 2024-07-16 NOTE — Telephone Encounter (Signed)
 Patient informed and patient scheduled with cardiology on 07/27/24.

## 2024-07-16 NOTE — Telephone Encounter (Signed)
 OV preop clearance appt now scheduled

## 2024-07-16 NOTE — Telephone Encounter (Signed)
 Per Delon, [place] she would like to have clearance from cardiologist.

## 2024-07-26 NOTE — Progress Notes (Unsigned)
 " Cardiology Office Note   Date:  07/27/2024  ID:  Madeline Flores, DOB 1976-07-18, MRN [id number] PCP: Cleotilde, [place]  E, PA  [facility] Providers Cardiologist:  None     PMH Paroxysmal SVT Palpitations Autonomic dysfunction Connective tissue disease  Seen by Dr. Waddell January 2024 after NF episode of suspected SVT.  Longstanding history of autonomic dysfunction with abnormal tilt table study.  She has managed this with adequate fluid and salt intake.  Developed an acute episode of heart racing with HR measured at 210 bpm on her Apple watch.  She has tried 2 different beta-blockers but had horrible intolerance to both.  She described fatigue, weakness, chest pain, and feeling like she could not get out of bed.  Seen by Dr. Wonda 02/03/2023 with questions about coronary vasospasm exacerbated by beta-blocker use.  She has not had more chest pain since stopping the beta-blocker.  No shortness of breath with physical activity including risk walking.  Continuing to have fleeting heart palpitations, but overall doing well in that regard.  ZIO monitor January 2024 revealed short supraventricular runs all less than 10 beats.  Dr. Wonda felt symptoms not necessarily indicative of underlying coronary vasospasm.  He prescribed diltiazem  30 mg to use as needed for episodes of heart racing or palpitations.  History of Present Illness  Discussed the use of AI scribe software for clinical note transcription with the patient, who gave verbal consent to proceed.  History of Present Illness Madeline Flores is a 48 year old female who presents for preoperative cardiac evaluation for upcoming colonoscopy. She has history of SVT with one prior 30 minute episode of intense palpitations which caused intense symptoms. She has not had additional episodes as intense since that time and has never taken the as needed diltiazem .  She reports occasional flutters and premature beats that have not caused  significant symptoms.  She aborts symptoms with breath-holding or coughing. Omeprazole  has markedly reduced her fluttering symptoms. She is physically active with walking and pickleball without exertional limitation. She tolerated anesthesia for tooth extraction a year ago without cardiovascular issues. History of connective tissue disorder classified as mild lupus and celiac disease, managed with diet. Recent increased food sensitivity is prompting both upper and lower endoscopy.  History of vasovagal syncope diagnosed in the past with fainting when younger. Strong family history of heart disease in both parents, both on cardiac medications. She is considering hormone replacement therapy for menopausal symptoms and is concerned about potential cardiac contraindications.   ROS: See HPI  Studies Reviewed EKG Interpretation Date/Time:  Tuesday July 27 2024 08:55:09 EST Ventricular Rate:  76 PR Interval:  142 QRS Duration:  68 QT Interval:  380 QTC Calculation: 427 R Axis:   83  Text Interpretation: Normal sinus rhythm Normal ECG No previous ECGs available Confirmed by Percy Browning [phone]) on 07/27/2024 9:04:46 AM     No results found for: LIPOA  Risk Assessment/Calculations           Physical Exam VS:  BP 120/82   Pulse 95   Ht 5' 4 (1.626 m)   Wt 108 lb 12.8 oz (49.4 kg)   LMP 05/18/2024 (Approximate)   SpO2 99%   BMI 18.68 kg/m    Wt Readings from Last 3 Encounters:  07/27/24 108 lb 12.8 oz (49.4 kg)  07/13/24 108 lb 4 oz (49.1 kg)  08/19/23 111 lb (50.3 kg)    GEN: Well nourished, well developed in no acute distress NECK: No  JVD; No carotid bruits CARDIAC: RRR, no murmurs, rubs, gallops RESPIRATORY:  Clear to auscultation without rales, wheezing or rhonchi  ABDOMEN: Soft, non-tender, non-distended EXTREMITIES:  No edema; No deformity    Assessment & Plan Preoperative cardiac evaluation  According to the Revised Cardiac Risk Index (RCRI), her Perioperative  Risk of Major Cardiac Event is (%): 0.4 Her Functional Capacity in METs is: 7.59 according to the Duke Activity Status Index (DASI). The patient is doing well from a cardiac perspective. Therefore, based on ACC/AHA guidelines, the patient would be at acceptable risk for the planned procedure without further cardiovascular testing.  - Forward clearance to requesting provider  Paroxysmal supraventricular tachycardia   Palpitations Occasional palpitations managed with vagal maneuvers. No further episodes of significant tachycardia and palpitations since initial visit. EKG shows normal sinus rhythm. Feels that omeprazole  has reduced symptoms. We discussed possibility of esophageal spasm or vagal nerve involvement. She has never taken prn diltiazem .   - Continue omeprazole  for symptom management - Will renew Rx for diltiazem  30 mg daily to use if significant symptoms return  Cardiac risk Family history of heart disease Reports both of her parents have heart disease that has been well-controlled because they both worked as geologist, engineering and have been very compliant with medications and recommendations.  She would like to consider testing for herself in the near future.  We discussed screening with CT calcium score which she will consider. - Consider CT calcium score for risk stratification - Maintain healthy lifestyle with being as physically active as possible every [place] and aiming for at least 150 minutes of moderate intensity exercise each week - Heart healthy diet avoiding processed foods, saturated fat, sugar, and other simple carbohydrates encouraged        Dispo: As needed  Signed, Rosaline Bane, NP-C "

## 2024-07-27 ENCOUNTER — Encounter (HOSPITAL_BASED_OUTPATIENT_CLINIC_OR_DEPARTMENT_OTHER): Payer: Self-pay | Admitting: Nurse Practitioner

## 2024-07-27 ENCOUNTER — Ambulatory Visit (HOSPITAL_BASED_OUTPATIENT_CLINIC_OR_DEPARTMENT_OTHER): Admitting: Nurse Practitioner

## 2024-07-27 VITALS — BP 120/82 | HR 95 | Ht 64.0 in | Wt 108.8 lb

## 2024-07-27 DIAGNOSIS — Z7189 Other specified counseling: Secondary | ICD-10-CM

## 2024-07-27 DIAGNOSIS — R002 Palpitations: Secondary | ICD-10-CM

## 2024-07-27 DIAGNOSIS — Z8249 Family history of ischemic heart disease and other diseases of the circulatory system: Secondary | ICD-10-CM | POA: Diagnosis not present

## 2024-07-27 DIAGNOSIS — I471 Supraventricular tachycardia, unspecified: Secondary | ICD-10-CM | POA: Diagnosis not present

## 2024-07-27 DIAGNOSIS — Z01810 Encounter for preprocedural cardiovascular examination: Secondary | ICD-10-CM | POA: Diagnosis not present

## 2024-07-27 MED ORDER — DILTIAZEM HCL 30 MG PO TABS: ORAL_TABLET | ORAL | 0 refills | Status: DC

## 2024-07-27 NOTE — Patient Instructions (Signed)
 Medication Instructions:   Your physician recommends that you continue on your current medications as directed. Please refer to the Current Medication list given to you today.   *If you need a refill on your cardiac medications before your next appointment, please call your pharmacy*  Lab Work:  None ordered.  If you have labs (blood work) drawn today and your tests are completely normal, you will receive your results only by: MyChart Message (if you have MyChart) OR A paper copy in the mail If you have any lab test that is abnormal or we need to change your treatment, we will call you to review the results.  Testing/Procedures:  Rosaline Percy PIETY wants patient to think about a  CT coronary calcium score.   Test locations:  [facility] at [facility] [place]  [facility] at [facility]  This is $99 out of pocket.   Coronary CalciumScan A coronary calcium scan is an imaging test used to look for deposits of calcium and other fatty materials (plaques) in the inner lining of the blood vessels of the heart (coronary arteries). These deposits of calcium and plaques can partly clog and narrow the coronary arteries without producing any symptoms or warning signs. This puts a person at risk for a heart attack. This test can detect these deposits before symptoms develop. Tell a health care provider about: Any allergies you have. All medicines you are taking, including vitamins, herbs, eye drops, creams, and over-the-counter medicines. Any problems you or family members have had with anesthetic medicines. Any blood disorders you have. Any surgeries you have had. Any medical conditions you have. Whether you are pregnant or may be pregnant. What are the risks? Generally, this is a safe procedure. However, problems may occur, including: Harm to a pregnant woman and her unborn baby. This test  involves the use of radiation. Radiation exposure can be dangerous to a pregnant woman and her unborn baby. If you are pregnant, you generally should not have this procedure done. Slight increase in the risk of cancer. This is because of the radiation involved in the test. What happens before the procedure? No preparation is needed for this procedure. What happens during the procedure? You will undress and remove any jewelry around your neck or chest. You will put on a hospital gown. Sticky electrodes will be placed on your chest. The electrodes will be connected to an electrocardiogram (ECG) machine to record a tracing of the electrical activity of your heart. A CT scanner will take pictures of your heart. During this time, you will be asked to lie still and hold your breath for 2-3 seconds while a picture of your heart is being taken. The procedure may vary among health care providers and hospitals. What happens after the procedure? You can get dressed. You can return to your normal activities. It is up to you to get the results of your test. Ask your health care provider, or the department that is doing the test, when your results will be ready. Summary A coronary calcium scan is an imaging test used to look for deposits of calcium and other fatty materials (plaques) in the inner lining of the blood vessels of the heart (coronary arteries). Generally, this is a safe procedure. Tell your health care provider if you are pregnant or may be pregnant. No preparation is needed for this procedure. A CT scanner will take pictures of your heart. You can return  to your normal activities after the scan is done. This information is not intended to replace advice given to you by your health care provider. Make sure you discuss any questions you have with your health care provider. Document Released: 01/11/2008 Document Revised: 06/03/2016 Document Reviewed: 06/03/2016 Elsevier Interactive Patient  Education  2017 [employer].   Follow-Up: At [facility], you and your health needs are our priority.  As part of our continuing mission to provide you with exceptional heart care, our providers are all part of one team.  This team includes your primary Cardiologist (physician) and Advanced Practice Providers or APPs (Physician Assistants and Nurse Practitioners) who all work together to provide you with the care you need, when you need it.  Your next appointment:   As needed   Provider:   Rosaline Bane, NP    We recommend signing up for the patient portal called MyChart.  Sign up information is provided on this After Visit Summary.  MyChart is used to connect with patients for Virtual Visits (Telemedicine).  Patients are able to view lab/test results, encounter notes, upcoming appointments, etc.  Non-urgent messages can be sent to your provider as well.   To learn more about what you can do with MyChart, go to [email or web address].

## 2024-07-30 NOTE — Telephone Encounter (Signed)
 Received clearance from cardiology. Patient scheduled with Dr. Nandigam 08/24/24

## 2024-07-31 ENCOUNTER — Other Ambulatory Visit (HOSPITAL_BASED_OUTPATIENT_CLINIC_OR_DEPARTMENT_OTHER): Payer: Self-pay | Admitting: Nurse Practitioner

## 2024-08-05 ENCOUNTER — Telehealth: Payer: Self-pay | Admitting: Physician Assistant

## 2024-08-05 DIAGNOSIS — R195 Other fecal abnormalities: Secondary | ICD-10-CM

## 2024-08-05 DIAGNOSIS — R1013 Epigastric pain: Secondary | ICD-10-CM

## 2024-08-05 MED ORDER — NA SULFATE-K SULFATE-MG SULF 17.5-3.13-1.6 GM/177ML PO SOLN: 1.0000 | Freq: Once | ORAL | 0 refills | Status: AC

## 2024-08-05 NOTE — Telephone Encounter (Signed)
 Inbound call from patient requesting a call back from Madeline Flores. She states that she has no prep or instructions for her procedures on 1/27 with Dr. Shila. Please advise.

## 2024-08-05 NOTE — Telephone Encounter (Signed)
 Instructions sent via Mychart and Suprep sent to pharmacy. Patient informed she will call back with any additional questions.

## 2024-08-19 ENCOUNTER — Telehealth: Payer: Self-pay | Admitting: Gastroenterology

## 2024-08-19 ENCOUNTER — Encounter: Admitting: Gastroenterology

## 2024-08-19 NOTE — Telephone Encounter (Signed)
 Spoke with sent new instructions via Mychart.

## 2024-08-19 NOTE — Telephone Encounter (Signed)
 Patient rescheduled appointment due to inclement weather and sickness in her house. New instructions sent via Mychart.

## 2024-08-19 NOTE — Telephone Encounter (Signed)
 Good Morning/Afternoon Dr. Nandigam,  We received a call from patient regarding upcoming colonoscopy on 08/24/2024. Patient rescheduled procedure due to upcoming weather and flu positive house. Patient rescheduled for 09/13/2024 at 2:30pm. Thank you.

## 2024-08-24 ENCOUNTER — Encounter: Admitting: Gastroenterology

## 2024-09-13 ENCOUNTER — Encounter: Admitting: Gastroenterology
